# Patient Record
Sex: Female | Born: 1955
Health system: Southern US, Community
[De-identification: ages and names within clinical notes are randomized; demographics above are authoritative.]

## PROBLEM LIST (undated history)

## (undated) DIAGNOSIS — I639 Cerebral infarction, unspecified: Secondary | ICD-10-CM

## (undated) DIAGNOSIS — I10 Essential (primary) hypertension: Secondary | ICD-10-CM

## (undated) DIAGNOSIS — R32 Unspecified urinary incontinence: Secondary | ICD-10-CM

## (undated) DIAGNOSIS — F32A Depression, unspecified: Secondary | ICD-10-CM

## (undated) DIAGNOSIS — F329 Major depressive disorder, single episode, unspecified: Secondary | ICD-10-CM

## (undated) DIAGNOSIS — F419 Anxiety disorder, unspecified: Secondary | ICD-10-CM

## (undated) DIAGNOSIS — T7840XA Allergy, unspecified, initial encounter: Secondary | ICD-10-CM

## (undated) DIAGNOSIS — N189 Chronic kidney disease, unspecified: Secondary | ICD-10-CM

## (undated) DIAGNOSIS — M1990 Unspecified osteoarthritis, unspecified site: Secondary | ICD-10-CM

## (undated) HISTORY — DX: Cerebral infarction, unspecified: I63.9

## (undated) HISTORY — DX: Allergy, unspecified, initial encounter: T78.40XA

## (undated) HISTORY — PX: CHOLECYSTECTOMY: SHX55

## (undated) HISTORY — DX: Anxiety disorder, unspecified: F41.9

## (undated) HISTORY — DX: Depression, unspecified: F32.A

## (undated) HISTORY — DX: Unspecified osteoarthritis, unspecified site: M19.90

## (undated) HISTORY — DX: Chronic kidney disease, unspecified: N18.9

## (undated) HISTORY — DX: Major depressive disorder, single episode, unspecified: F32.9

## (undated) HISTORY — DX: Essential (primary) hypertension: I10

## (undated) HISTORY — DX: Unspecified urinary incontinence: R32

---

## 2009-04-19 ENCOUNTER — Ambulatory Visit: Payer: Self-pay | Admitting: Internal Medicine

## 2014-04-11 ENCOUNTER — Ambulatory Visit: Payer: Self-pay

## 2014-04-11 LAB — COMPREHENSIVE METABOLIC PANEL
ALT: 18 U/L
ANION GAP: 8 (ref 7–16)
Albumin: 3.5 g/dL (ref 3.4–5.0)
Alkaline Phosphatase: 82 U/L
BUN: 38 mg/dL — ABNORMAL HIGH (ref 7–18)
Bilirubin,Total: 0.3 mg/dL (ref 0.2–1.0)
CREATININE: 2.32 mg/dL — AB (ref 0.60–1.30)
Calcium, Total: 8.7 mg/dL (ref 8.5–10.1)
Chloride: 108 mmol/L — ABNORMAL HIGH (ref 98–107)
Co2: 27 mmol/L (ref 21–32)
EGFR (African American): 28 — ABNORMAL LOW
EGFR (Non-African Amer.): 23 — ABNORMAL LOW
GLUCOSE: 132 mg/dL — AB (ref 65–99)
Osmolality: 296 (ref 275–301)
Potassium: 4.8 mmol/L (ref 3.5–5.1)
SGOT(AST): 16 U/L (ref 15–37)
Sodium: 143 mmol/L (ref 136–145)
Total Protein: 7 g/dL (ref 6.4–8.2)

## 2014-12-04 ENCOUNTER — Ambulatory Visit (INDEPENDENT_AMBULATORY_CARE_PROVIDER_SITE_OTHER): Payer: Medicaid Other | Admitting: Family Medicine

## 2014-12-04 ENCOUNTER — Encounter: Payer: Self-pay | Admitting: Family Medicine

## 2014-12-04 VITALS — BP 160/94 | HR 94 | Temp 98.7°F | Resp 16 | Ht 68.0 in | Wt 200.2 lb

## 2014-12-04 DIAGNOSIS — Z720 Tobacco use: Secondary | ICD-10-CM | POA: Diagnosis not present

## 2014-12-04 DIAGNOSIS — N184 Chronic kidney disease, stage 4 (severe): Secondary | ICD-10-CM | POA: Diagnosis not present

## 2014-12-04 DIAGNOSIS — Z7689 Persons encountering health services in other specified circumstances: Secondary | ICD-10-CM

## 2014-12-04 DIAGNOSIS — I6930 Unspecified sequelae of cerebral infarction: Secondary | ICD-10-CM | POA: Diagnosis not present

## 2014-12-04 DIAGNOSIS — F418 Other specified anxiety disorders: Secondary | ICD-10-CM | POA: Insufficient documentation

## 2014-12-04 DIAGNOSIS — Z7189 Other specified counseling: Secondary | ICD-10-CM

## 2014-12-04 DIAGNOSIS — Z1239 Encounter for other screening for malignant neoplasm of breast: Secondary | ICD-10-CM

## 2014-12-04 DIAGNOSIS — E785 Hyperlipidemia, unspecified: Secondary | ICD-10-CM | POA: Diagnosis not present

## 2014-12-04 DIAGNOSIS — J302 Other seasonal allergic rhinitis: Secondary | ICD-10-CM

## 2014-12-04 DIAGNOSIS — I10 Essential (primary) hypertension: Secondary | ICD-10-CM | POA: Insufficient documentation

## 2014-12-04 MED ORDER — LISINOPRIL 20 MG PO TABS
20.0000 mg | ORAL_TABLET | Freq: Every day | ORAL | Status: DC
Start: 2014-12-04 — End: 2014-12-21

## 2014-12-04 MED ORDER — LORATADINE 10 MG PO TABS
10.0000 mg | ORAL_TABLET | Freq: Every day | ORAL | Status: DC
Start: 2014-12-04 — End: 2015-12-06

## 2014-12-04 MED ORDER — CO-ENZYME Q-10 50 MG PO CAPS: 50.0000 mg | ORAL_CAPSULE | Freq: Every day | ORAL | Status: DC

## 2014-12-04 MED ORDER — ATORVASTATIN CALCIUM 20 MG PO TABS: 20.0000 mg | ORAL_TABLET | Freq: Every day | ORAL | Status: DC

## 2014-12-04 MED ORDER — CITALOPRAM HYDROBROMIDE 20 MG PO TABS: 20.0000 mg | ORAL_TABLET | Freq: Every day | ORAL | Status: DC

## 2014-12-04 MED ORDER — METOPROLOL SUCCINATE ER 25 MG PO TB24: 25.0000 mg | ORAL_TABLET | Freq: Every day | ORAL | Status: DC

## 2014-12-04 NOTE — Assessment & Plan Note (Signed)
Trial of lower dose of statin to see if patient tolerates. Try co-enzyme q10 as to help muscle aches. Can do alternate day dosing. Would like to continue statin is possible for secondary prevention.

## 2014-12-04 NOTE — Addendum Note (Signed)
Addended byVincenza Hews, Savyon Loken L on: 12/04/2014 05:18 PM   Modules accepted: Orders

## 2014-12-04 NOTE — Assessment & Plan Note (Signed)
Per patient report. Check CMP today. Refer to nephrology for management as needed. Pt is adhering to renal diet at home- no tomatoes, oranges, or bananas.

## 2014-12-04 NOTE — Assessment & Plan Note (Signed)
Restart citalopram daily for anxiety. Follow-up 1 mos.

## 2014-12-04 NOTE — Assessment & Plan Note (Signed)
D/C Lisinopril-HCTZ due to kidneys. Start Lisinopril 20 mg, restart metoprolol. DASH diet reviewed. Encouraged smoking cessation and exercise. Check BP daily.  RTC in 2 weeks to check BP Consider CCB for BP control.

## 2014-12-04 NOTE — Patient Instructions (Signed)
BP: Stop Lisinopril HCTZ, and start Lisinopril 20mg  for BP. Please check your BP at home.  Your goal blood pressure is 140/90 Work on low salt/sodium diet - goal <1.5gm (1,500mg ) per day. Eat a diet high in fruits/vegetables and whole grains.  Look into mediterranean and DASH diet. Goal activity is 135min/wk of moderate intensity exercise.  This can be split into 30 minute chunks.  If you are not at this level, you can start with smaller 10-15 min increments and slowly build up activity. Look at [facility].org for more resources  Please seek immediate medical attention at ER or Urgent Care if you develop: Chest pain, pressure or tightness. Shortness of breath accompanied by nausea or diaphoresis Visual changes Numbness or tingling on one side of the body Facial droop Altered mental status Or any concerning symptoms.  Cholesterol medication: Take 1/2 tablet of current atorvastatin. Trial co-enzyme Q10 to help with muscle aches. Can take 1/2 tablet every other day to help with muscle aches.   Anxiety: Restart citalopram daily. Cut first 2 tablets in 1/2 and take 1/2 tablet for the first 4 days to help cut down on side effects.   Kidneys: We will refer you to nephrology after you get your lab work for evaluation.

## 2014-12-04 NOTE — Progress Notes (Signed)
Subjective:    Patient ID: Vanessa Camacho, female    DOB: May 14, 1956, 59 y.o.   MRN: [id number]  HPI: Vanessa Camacho is a 59 y.o. female presenting on 12/04/2014 for Establish Care   HPI Pt presents to establish care today. She has just moved from [place]. Previous care provider was in [facility]- records have been requested and will be reviewed. Current medical history includes: Stroke: 12/2013. She has never followed up with neurology. Unsure if ischemic or hemorrhagic. Pt is taking aspirin and statin for secondary prevention. Residual weakness and memory loss.  Weakness is on L side.  Anxiety/agitiation: Takes occasional ativan. Take citalopram $RemoveBeforeD'20mg'[id number]$  daily to help with her anxiety.  High blood pressure: Dx 10-20  years ago. Does not check BP at home. No daily HA or dizziness. Occasional visual changes- pt used to solve by eating banana. Pt has been out of metoprolol for several weeks and has only been taking her Lisinopril HCTZ every other day. Her BP has been running very high at home. Cholesterol: On statin but she had muscle aches so she stopped taking.  Kidney disease: Saw a kidney specialist in [place]. Pt was told she has stage 4 kidney failure. She attempts to eat renal diet. No urinary changes.  Seasonal allergies: Pt take zyrtec for allergies.   Health maintenance:  Last pap- unsure thinks a couple of years- normal.  Mammogram: unsure > 5 years.  Colonoscopy: Pt has never had one, she declines colonoscopy today but would like FOBT.  Smoker- 1 pack per week. Pt is trying to quit. She started smoking when she ran out of anti-depressant medications. Smoked on/off since age 89, <1 pack per day.  Exercise: Pt has no formal exercise- tries to stay active. Swims laps 3-4x per week.  Marijuana- 2-3 times per week.  No alcohol.   Past Medical History  Diagnosis Date  . Allergy   . Arthritis   . Depression   . Hypertension   . Stroke   . Chronic kidney disease      stage 4 renal failure past urologist was in [place]   History   Social History  . Marital Status: Married    Spouse Name: N/A  . Number of Children: N/A  . Years of Education: N/A   Occupational History  . Not on file.   Social History Main Topics  . Smoking status: Current Every Day Smoker -- 1.00 packs/day    Types: Cigarettes  . Smokeless tobacco: Not on file  . Alcohol Use: Yes  . Drug Use: No  . Sexual Activity: Not on file   Other Topics Concern  . Not on file   Social History Narrative  . No narrative on file   Family History  Problem Relation Age of Onset  . Stroke Mother   . Dementia Mother   . Hypertension Mother   . Cancer Other     sibling skin cancer   No current outpatient prescriptions on file prior to visit.   No current facility-administered medications on file prior to visit.    Review of Systems  Constitutional: Negative for fever and chills.  Eyes: Negative.   Respiratory: Negative for cough, chest tightness and wheezing.   Cardiovascular: Negative for chest pain and leg swelling.  Gastrointestinal: Negative for nausea, vomiting, abdominal pain, diarrhea and constipation.  Endocrine: Negative.  Negative for cold intolerance, heat intolerance, polydipsia, polyphagia and polyuria.  Genitourinary: Negative for dysuria and difficulty urinating.  Musculoskeletal:  Negative.   Neurological: Positive for weakness (L side residual from stroke.) and headaches (occasional.). Negative for dizziness, light-headedness and numbness.  Psychiatric/Behavioral: Positive for sleep disturbance (sleeps in living room.). The patient is nervous/anxious.    Per HPI unless specifically indicated above     Objective:    BP 160/94 mmHg  Pulse 94  Temp(Src) 98.7 F (37.1 C) (Oral)  Resp 16  Ht $R'5\' 8"'gx$  (1.727 m)  Wt 200 lb 3.2 oz (90.81 kg)  BMI 30.45 kg/m2  LMP   Wt Readings from Last 3 Encounters:  12/04/14 200 lb 3.2 oz (90.81 kg)    Physical Exam   Constitutional: She is oriented to person, place, and time. She appears well-developed and well-nourished. No distress.  Neck: Normal range of motion. Neck supple. No thyromegaly present.  Cardiovascular: Normal rate and regular rhythm.  Exam reveals no gallop and no friction rub.   No murmur heard. Pulmonary/Chest: Effort normal and breath sounds normal.  Abdominal: Soft. Bowel sounds are normal. There is no tenderness. There is no rebound.  Musculoskeletal: Normal range of motion. She exhibits no edema or tenderness.  Lymphadenopathy:    She has no cervical adenopathy.  Neurological: She is alert and oriented to person, place, and time.  Skin: Skin is warm and dry. She is not diaphoretic.  Psychiatric: She has a normal mood and affect. Her speech is normal and behavior is normal. Judgment and thought content normal. Cognition and memory are normal.   Results for orders placed or performed in visit on 04/11/14  Comprehensive metabolic panel  Result Value Ref Range   Glucose 132 (H) 65-99 mg/dL   BUN 38 (H) 7-18 mg/dL   Creatinine 2.32 (H) 0.60-1.30 mg/dL   Sodium 143 136-145 mmol/L   Potassium 4.8 3.5-5.1 mmol/L   Chloride 108 (H) 98-107 mmol/L   Co2 27 21-32 mmol/L   Calcium, Total 8.7 8.5-10.1 mg/dL   SGOT(AST) 16 15-37 Unit/L   SGPT (ALT) 18 U/L   Alkaline Phosphatase 82 Unit/L   Albumin 3.5 3.4-5.0 g/dL   Total Protein 7.0 6.4-8.2 g/dL   Bilirubin,Total 0.3 0.2-1.0 mg/dL   Osmolality 296 275-301   Anion Gap 8 7-16   EGFR (African American) 28 (L) >52mL/min   EGFR (Non-African Amer.) 23 (L) >64mL/min      Assessment & Plan:   Problem List Items Addressed This Visit      Cardiovascular and Mediastinum   Hypertension    D/C Lisinopril-HCTZ due to kidneys. Start Lisinopril 20 mg, restart metoprolol. DASH diet reviewed. Encouraged smoking cessation and exercise. Check BP daily.  RTC in 2 weeks to check BP Consider CCB for BP control.       Relevant Medications    aspirin 81 MG chewable tablet   metoprolol succinate (TOPROL-XL) 25 MG 24 hr tablet   atorvastatin (LIPITOR) 20 MG tablet   lisinopril (PRINIVIL,ZESTRIL) 20 MG tablet     Respiratory   Seasonal allergies    Start Claritin daily.       Relevant Medications   loratadine (CLARITIN) 10 MG tablet     Genitourinary   CKD (chronic kidney disease) stage 4, GFR 15-29 ml/min    Per patient report. Check CMP today. Refer to nephrology for management as needed. Pt is adhering to renal diet at home- no tomatoes, oranges, or bananas.       Relevant Medications   lisinopril (PRINIVIL,ZESTRIL) 20 MG tablet   Other Relevant Orders   Comprehensive Metabolic Panel (CMET)  Other   H/O: stroke with residual effects    Refer to neurology to establish care. Aspirin for secondary prevention. Check CMP and lipids. Goal to control BP. Trial of statin for secondary prevention.       Relevant Orders   Lipid Profile   Ambulatory referral to Neurology   Anxiety associated with depression    Restart citalopram daily for anxiety. Follow-up 1 mos.       Relevant Medications   citalopram (CELEXA) 20 MG tablet   Hyperlipidemia    Trial of lower dose of statin to see if patient tolerates. Try co-enzyme q10 as to help muscle aches. Can do alternate day dosing. Would like to continue statin is possible for secondary prevention.       Relevant Medications   aspirin 81 MG chewable tablet   metoprolol succinate (TOPROL-XL) 25 MG 24 hr tablet   atorvastatin (LIPITOR) 20 MG tablet   co-enzyme Q-10 50 MG capsule   lisinopril (PRINIVIL,ZESTRIL) 20 MG tablet   Tobacco use    Pt plans to quit smoking when she resumes her citalopram. Goal is to quit by August 1.        Other Visit Diagnoses    Encounter to establish care    -  Primary       Meds ordered this encounter  Medications  . aspirin 81 MG chewable tablet    Sig: Chew by mouth daily.  Marland Kitchen DISCONTD: metoprolol succinate (TOPROL-XL) 25 MG 24 hr  tablet    Sig: Take 25 mg by mouth daily.  Marland Kitchen DISCONTD: atorvastatin (LIPITOR) 40 MG tablet    Sig: Take 40 mg by mouth daily.  Marland Kitchen DISCONTD: lisinopril-hydrochlorothiazide (PRINZIDE,ZESTORETIC) 10-12.5 MG per tablet    Sig: Take 1 tablet by mouth 2 (two) times daily.  Marland Kitchen LORazepam (ATIVAN) 0.5 MG tablet    Sig: Take 0.5 mg by mouth every 8 (eight) hours.  . metoprolol succinate (TOPROL-XL) 25 MG 24 hr tablet    Sig: Take 1 tablet (25 mg total) by mouth daily.    Dispense:  30 tablet    Refill:  11    Order Specific Question:  Supervising Provider    Answer:  Arlis Porta [phone]  . atorvastatin (LIPITOR) 20 MG tablet    Sig: Take 1 tablet (20 mg total) by mouth daily.    Dispense:  90 tablet    Refill:  3    Order Specific Question:  Supervising Provider    Answer:  Arlis Porta [phone]  . co-enzyme Q-10 50 MG capsule    Sig: Take 1 capsule (50 mg total) by mouth daily.    Dispense:  30 capsule    Refill:  11    Order Specific Question:  Supervising Provider    Answer:  Arlis Porta [phone]  . lisinopril (PRINIVIL,ZESTRIL) 20 MG tablet    Sig: Take 1 tablet (20 mg total) by mouth daily.    Dispense:  30 tablet    Refill:  11    Order Specific Question:  Supervising Provider    Answer:  Arlis Porta [phone]  . loratadine (CLARITIN) 10 MG tablet    Sig: Take 1 tablet (10 mg total) by mouth daily.    Dispense:  30 tablet    Refill:  11    Order Specific Question:  Supervising Provider    Answer:  Arlis Porta [phone]  . citalopram (CELEXA) 20 MG tablet  Sig: Take 1 tablet (20 mg total) by mouth daily.    Dispense:  30 tablet    Refill:  11    Order Specific Question:  Supervising Provider    Answer:  Arlis Porta [phone]      Follow up plan: Return in about 2 weeks (around 12/18/2014) for BP.

## 2014-12-04 NOTE — Assessment & Plan Note (Signed)
Start Claritin daily 

## 2014-12-04 NOTE — Assessment & Plan Note (Signed)
Refer to neurology to establish care. Aspirin for secondary prevention. Check CMP and lipids. Goal to control BP. Trial of statin for secondary prevention.

## 2014-12-04 NOTE — Assessment & Plan Note (Signed)
Pt plans to quit smoking when she resumes her citalopram. Goal is to quit by August 1.

## 2014-12-14 ENCOUNTER — Encounter: Payer: Self-pay | Admitting: Family Medicine

## 2014-12-14 ENCOUNTER — Telehealth: Payer: Self-pay | Admitting: Family Medicine

## 2014-12-14 NOTE — Telephone Encounter (Signed)
Pt has appointment with Dr. Charlett Blake Neuro on 02/14/2015 at 9:30 am [facility]

## 2014-12-14 NOTE — Telephone Encounter (Signed)
Pt also has appointment with [facility] on 12/20/2014 at 3 pm pt notify reg both appointment and reminder latter send per her request. Vanessa Camacho

## 2014-12-20 ENCOUNTER — Ambulatory Visit
Admission: RE | Admit: 2014-12-20 | Discharge: 2014-12-20 | Disposition: A | Discharge status: Home / Self Care | Payer: Medicaid Other | Source: Ambulatory Visit | Attending: Family Medicine | Admitting: Family Medicine

## 2014-12-20 DIAGNOSIS — Z1231 Encounter for screening mammogram for malignant neoplasm of breast: Secondary | ICD-10-CM | POA: Diagnosis not present

## 2014-12-20 DIAGNOSIS — Z1239 Encounter for other screening for malignant neoplasm of breast: Secondary | ICD-10-CM

## 2014-12-21 ENCOUNTER — Encounter: Payer: Self-pay | Admitting: Family Medicine

## 2014-12-21 ENCOUNTER — Ambulatory Visit (INDEPENDENT_AMBULATORY_CARE_PROVIDER_SITE_OTHER): Payer: Medicaid Other | Admitting: Family Medicine

## 2014-12-21 VITALS — BP 154/88 | HR 69 | Temp 98.1°F | Resp 16 | Ht 68.0 in | Wt 197.0 lb

## 2014-12-21 DIAGNOSIS — F329 Major depressive disorder, single episode, unspecified: Secondary | ICD-10-CM | POA: Diagnosis not present

## 2014-12-21 DIAGNOSIS — F32A Depression, unspecified: Secondary | ICD-10-CM

## 2014-12-21 DIAGNOSIS — F418 Other specified anxiety disorders: Secondary | ICD-10-CM | POA: Diagnosis not present

## 2014-12-21 DIAGNOSIS — N184 Chronic kidney disease, stage 4 (severe): Secondary | ICD-10-CM

## 2014-12-21 DIAGNOSIS — I10 Essential (primary) hypertension: Secondary | ICD-10-CM | POA: Diagnosis not present

## 2014-12-21 DIAGNOSIS — R5383 Other fatigue: Secondary | ICD-10-CM

## 2014-12-21 MED ORDER — LISINOPRIL 40 MG PO TABS: 40.0000 mg | ORAL_TABLET | Freq: Every day | ORAL | Status: DC

## 2014-12-21 NOTE — Patient Instructions (Signed)
Blood pressure: Continue to monitor your BP at home. Please take 2 pills of Lisinopril in the AM until you run out of the current bottle. Then pick up new prescription  Your goal blood pressure is 140/90. Work on low salt/sodium diet - goal <1.5gm (1,500mg ) per day. Eat a diet high in fruits/vegetables and whole grains.  Look into mediterranean and DASH diet. Goal activity is 128min/wk of moderate intensity exercise.  This can be split into 30 minute chunks.  If you are not at this level, you can start with smaller 10-15 min increments and slowly build up activity. Look at [facility].org for more resources  Please seek immediate medical attention at ER or Urgent Care if you develop: Chest pain, pressure or tightness. Shortness of breath accompanied by nausea or diaphoresis Visual changes Numbness or tingling on one side of the body Facial droop Altered mental status Or any concerning symptoms.

## 2014-12-21 NOTE — Assessment & Plan Note (Signed)
Pt reports she has noticed a change since restarting celexa. Continue current dosing of celexa. Repeat PHQ-9 at next visit. Consider dose titration if needed.  Check TSH, Vitamin D

## 2014-12-21 NOTE — Progress Notes (Signed)
Subjective:    Patient ID: Vanessa Camacho, female    DOB: [birth year], 59 y.o.   MRN: [id number]  HPI: Vanessa Camacho is a 59 y.o. female presenting on 12/21/2014 for Follow-up and Hypertension  Pt presents to follow-up on Depression and hypertension. Pt is checking her BP at the drug 160/80-90. She has not yet gotten lab work done.     Depression/anxiety: Restarted her celexa, feeling well. Noticed she is feeling more calm, able to control her temper. Less anxious.  She is however reporting some fatigue and tiredness.   Hypertension This is a chronic problem. The current episode started more than 1 year ago. The problem is uncontrolled. Pertinent negatives include no anxiety, chest pain, headaches, palpitations, peripheral edema or shortness of breath. There are no associated agents to hypertension. Risk factors for coronary artery disease include smoking/tobacco exposure, family history and dyslipidemia. Past treatments include ACE inhibitors and beta blockers. The current treatment provides moderate improvement. Hypertensive end-organ damage includes CVA.    Past Medical History  Diagnosis Date  . Allergy   . Arthritis   . Depression   . Hypertension   . Stroke   . Chronic kidney disease     stage 4 renal failure past urologist was in [place]    Current Outpatient Prescriptions on File Prior to Visit  Medication Sig  . aspirin 81 MG chewable tablet Chew by mouth daily.  Marland Kitchen atorvastatin (LIPITOR) 20 MG tablet Take 1 tablet (20 mg total) by mouth daily.  . citalopram (CELEXA) 20 MG tablet Take 1 tablet (20 mg total) by mouth daily.  Marland Kitchen co-enzyme Q-10 50 MG capsule Take 1 capsule (50 mg total) by mouth daily.  Marland Kitchen loratadine (CLARITIN) 10 MG tablet Take 1 tablet (10 mg total) by mouth daily.  Marland Kitchen LORazepam (ATIVAN) 0.5 MG tablet Take 0.5 mg by mouth every 8 (eight) hours.  . metoprolol succinate (TOPROL-XL) 25 MG 24 hr tablet Take 1 tablet (25 mg total) by mouth daily.   No current  facility-administered medications on file prior to visit.    Review of Systems  Constitutional: Negative for fever and chills.  Respiratory: Negative for shortness of breath.   Cardiovascular: Negative for chest pain and palpitations.  Endocrine: Negative.   Genitourinary: Negative.   Neurological: Positive for weakness (residual from stroke). Negative for dizziness, syncope and headaches.  Psychiatric/Behavioral: Negative for dysphoric mood and agitation. The patient is nervous/anxious (improving since last visit.).    Per HPI unless specifically indicated above     Objective:    BP 154/88 mmHg  Pulse 69  Temp(Src) 98.1 F (36.7 C) (Oral)  Resp 16  Ht 5' 8" (1.727 m)  Wt 197 lb (89.359 kg)  BMI 29.96 kg/m2  Wt Readings from Last 3 Encounters:  12/21/14 197 lb (89.359 kg)  12/04/14 200 lb 3.2 oz (90.81 kg)    Physical Exam  Constitutional: She is oriented to person, place, and time. She appears well-developed and well-nourished. No distress.  Neck: Normal range of motion. Neck supple. No thyromegaly present.  Cardiovascular: Normal rate and regular rhythm.  Exam reveals no gallop and no friction rub.   No murmur heard. Pulmonary/Chest: Effort normal and breath sounds normal. No respiratory distress. She has no wheezes. She exhibits no tenderness.  Abdominal: Soft. Bowel sounds are normal. There is no tenderness. There is no rebound.  Musculoskeletal: Normal range of motion. She exhibits no edema or tenderness.  Lymphadenopathy:    She has no cervical  adenopathy.  Neurological: She is alert and oriented to person, place, and time.  Skin: Skin is warm and dry. She is not diaphoretic.   Results for orders placed or performed in visit on 04/11/14  Comprehensive metabolic panel  Result Value Ref Range   Glucose 132 (H) 65-99 mg/dL   BUN 38 (H) 7-18 mg/dL   Creatinine 2.32 (H) 0.60-1.30 mg/dL   Sodium 143 136-145 mmol/L   Potassium 4.8 3.5-5.1 mmol/L   Chloride 108 (H)  98-107 mmol/L   Co2 27 21-32 mmol/L   Calcium, Total 8.7 8.5-10.1 mg/dL   SGOT(AST) 16 15-37 Unit/L   SGPT (ALT) 18 U/L   Alkaline Phosphatase 82 Unit/L   Albumin 3.5 3.4-5.0 g/dL   Total Protein 7.0 6.4-8.2 g/dL   Bilirubin,Total 0.3 0.2-1.0 mg/dL   Osmolality 296 275-301   Anion Gap 8 7-16   EGFR (African American) 28 (L) >37m/min   EGFR (Non-African Amer.) 23 (L) >612mmin      Assessment & Plan:   Problem List Items Addressed This Visit      Cardiovascular and Mediastinum   Hypertension    Increase lisinopril to 4052maily. Pt encouraged to check BP daily- call if consistently elevated > 150/90. DASH diet reviewed.  Alarm symptoms reviewed. RTC 3 weeks.       Relevant Medications   lisinopril (PRINIVIL,ZESTRIL) 40 MG tablet     Genitourinary   CKD (chronic kidney disease) stage 4, GFR 15-29 ml/min    Check CMP- consider referral to nephrology with confirmation.         Other   Anxiety associated with depression    Pt reports she has noticed a change since restarting celexa. Continue current dosing of celexa. Repeat PHQ-9 at next visit. Consider dose titration if needed.  Check TSH, Vitamin D       Other Visit Diagnoses    Fatigue due to depression    -  Primary    Relevant Orders    Vit D  25 hydroxy (rtn osteoporosis monitoring)    TSH       Meds ordered this encounter  Medications  . lisinopril (PRINIVIL,ZESTRIL) 40 MG tablet    Sig: Take 1 tablet (40 mg total) by mouth daily.    Dispense:  30 tablet    Refill:  11    Order Specific Question:  Supervising Provider    Answer:  HAWArlis [phone]   Follow up plan: Return in about 3 weeks (around 01/11/2015).

## 2014-12-21 NOTE — Assessment & Plan Note (Signed)
Check CMP- consider referral to nephrology with confirmation.

## 2014-12-21 NOTE — Assessment & Plan Note (Signed)
Increase lisinopril to 40mg  daily. Pt encouraged to check BP daily- call if consistently elevated > 150/90. DASH diet reviewed.  Alarm symptoms reviewed. RTC 3 weeks.

## 2014-12-22 LAB — COMPREHENSIVE METABOLIC PANEL
ALBUMIN: 4.3 g/dL (ref 3.5–5.5)
ALT: 7 IU/L (ref 0–32)
AST: 9 IU/L (ref 0–40)
Albumin/Globulin Ratio: 1.7 (ref 1.1–2.5)
Alkaline Phosphatase: 81 IU/L (ref 39–117)
BILIRUBIN TOTAL: 0.5 mg/dL (ref 0.0–1.2)
BUN/Creatinine Ratio: 13 (ref 9–23)
BUN: 30 mg/dL — AB (ref 6–24)
CO2: 24 mmol/L (ref 18–29)
CREATININE: 2.31 mg/dL — AB (ref 0.57–1.00)
Calcium: 9.7 mg/dL (ref 8.7–10.2)
Chloride: 104 mmol/L (ref 97–108)
GFR, EST AFRICAN AMERICAN: 26 mL/min/{1.73_m2} — AB (ref >59)
GFR, EST NON AFRICAN AMERICAN: 23 mL/min/{1.73_m2} — AB (ref >59)
GLOBULIN, TOTAL: 2.5 g/dL (ref 1.5–4.5)
GLUCOSE: 115 mg/dL — AB (ref 65–99)
Potassium: 5.8 mmol/L — ABNORMAL HIGH (ref 3.5–5.2)
Sodium: 144 mmol/L (ref 134–144)
TOTAL PROTEIN: 6.8 g/dL (ref 6.0–8.5)

## 2014-12-22 LAB — LIPID PANEL
CHOLESTEROL TOTAL: 124 mg/dL (ref 100–199)
Chol/HDL Ratio: 3.1 ratio units (ref 0.0–4.4)
HDL: 40 mg/dL (ref >39)
LDL Calculated: 70 mg/dL (ref 0–99)
TRIGLYCERIDES: 68 mg/dL (ref 0–149)
VLDL Cholesterol Cal: 14 mg/dL (ref 5–40)

## 2014-12-24 ENCOUNTER — Telehealth: Payer: Self-pay | Admitting: Family Medicine

## 2014-12-24 DIAGNOSIS — N184 Chronic kidney disease, stage 4 (severe): Secondary | ICD-10-CM

## 2014-12-24 DIAGNOSIS — I10 Essential (primary) hypertension: Secondary | ICD-10-CM

## 2014-12-24 NOTE — Telephone Encounter (Signed)
Attempted to call patient lab results but number has been disconnected. Called pt emergency contact to get updated number. Please edit the number if they call back.

## 2014-12-24 NOTE — Telephone Encounter (Signed)
Called pt lab results- no answer. LMTCB.

## 2014-12-25 LAB — SPECIMEN STATUS REPORT

## 2014-12-25 LAB — VITAMIN D 25 HYDROXY (VIT D DEFICIENCY, FRACTURES): Vit D, 25-Hydroxy: 21.9 ng/mL — ABNORMAL LOW (ref 30.0–100.0)

## 2014-12-25 LAB — TSH: TSH: 0.79 u[IU]/mL (ref 0.450–4.500)

## 2014-12-26 MED ORDER — AMLODIPINE BESYLATE 10 MG PO TABS: 10.0000 mg | ORAL_TABLET | Freq: Every day | ORAL | Status: DC

## 2014-12-26 MED ORDER — FUROSEMIDE 20 MG PO TABS: 20.0000 mg | ORAL_TABLET | Freq: Every day | ORAL | Status: DC

## 2014-12-26 NOTE — Telephone Encounter (Signed)
Called pt- LMTCB.    Labs: Kidney function is low- would like her to see nephrology.  Her potassium is also high. She needs to STOP lisinopril, this can raise her potassium.  I am giving her 3 days worth of lasix to help reduce her potassium.  I am changing her blood pressure medication to Amlodipine 10mg  daily.

## 2014-12-27 ENCOUNTER — Encounter: Payer: Self-pay | Admitting: Family Medicine

## 2014-12-27 NOTE — Telephone Encounter (Signed)
Vanessa Camacho CMA attempted to call patient several times yesterday. Left message instructing patient to call office ASAP regarding lab work. Pt has not called back. A letter will be sent to patient household in an attempt to communicate with her. Will also attempt to call emergency contact to get in contact with the patient.

## 2014-12-27 NOTE — Telephone Encounter (Signed)
Spoke with daughter and asked her to have mother give [place] a call regarding her lab work. Daughter gave [place] an alternate number to contact patient.

## 2014-12-27 NOTE — Telephone Encounter (Signed)
Spoke wit pt advised her not to take lisinopril and start lasix for 3 days and Amlodipine instead of lisinopril pt wants a letter send out with all these info since her stroke history and understand very well today and advised her to have big sticky on freeze so that she can remember and Amy will mail her a letter with all these info and her appointment time for [facility] [place] [facility] too.

## 2015-01-11 ENCOUNTER — Encounter: Payer: Self-pay | Admitting: Family Medicine

## 2015-01-11 ENCOUNTER — Ambulatory Visit (INDEPENDENT_AMBULATORY_CARE_PROVIDER_SITE_OTHER): Payer: Medicaid Other | Admitting: Family Medicine

## 2015-01-11 VITALS — BP 146/80 | HR 60 | Temp 98.7°F | Resp 16 | Ht 68.0 in | Wt 195.6 lb

## 2015-01-11 DIAGNOSIS — I10 Essential (primary) hypertension: Secondary | ICD-10-CM

## 2015-01-11 DIAGNOSIS — N184 Chronic kidney disease, stage 4 (severe): Secondary | ICD-10-CM

## 2015-01-11 NOTE — Patient Instructions (Addendum)
Neurology Appt: February 14, 2015  Vickki Hearing, MD  [place]  [facility]  [place], [place] [address]  [phone]  [phone] (Fax)   Please continue to check your blood pressure at home. We will let the kidney specialist weigh in on whether you need to go back on the lisinopril for your blood pressure.  Your goal blood pressure is 140/90 Work on low salt/sodium diet - goal <2.5gm (2500mg ) per day. Eat a diet high in fruits/vegetables and whole grains.  Look into mediterranean and DASH diet. Goal activity is 157min/wk of moderate intensity exercise.  This can be split into 30 minute chunks.  If you are not at this level, you can start with smaller 10-15 min increments and slowly build up activity. Look at [facility].org for more resources   Please seek immediate medical attention at ER or Urgent Care if you develop: Chest pain, pressure or tightness. Shortness of breath accompanied by nausea or diaphoresis Visual changes Numbness or tingling on one side of the body Facial droop Altered mental status Or any concerning symptoms.

## 2015-01-11 NOTE — Progress Notes (Signed)
Subjective:    Patient ID: Vanessa Camacho, female    DOB: [birth year], 59 y.o.   MRN: [id number]  HPI: Vanessa Camacho is a 59 y.o. female presenting on 01/11/2015 for Hypertension   Hypertension This is a chronic problem. The problem has been waxing and waning since onset. Pertinent negatives include no anxiety, chest pain, headaches, palpitations, peripheral edema or shortness of breath. Past treatments include calcium channel blockers. The current treatment provides moderate improvement. Hypertensive end-organ damage includes kidney disease and CVA. Identifiable causes of hypertension include chronic renal disease.    Pt presents for blood pressure follow up. Her potassium increased on lisinopril. She was given a few days of lasix. Started on amlodipine. She will see nephrology on 01/17/2015 for consult on her kidney disease. Denies chest pain, shortness of breath, visual changes, daily HA or floaters. Checking BP at home- avg 150/80.   Past Medical History  Diagnosis Date  . Allergy   . Arthritis   . Depression   . Hypertension   . Stroke   . Chronic kidney disease     stage 4 renal failure past urologist was in [place]    Current Outpatient Prescriptions on File Prior to Visit  Medication Sig  . amLODipine (NORVASC) 10 MG tablet Take 1 tablet (10 mg total) by mouth daily.  Marland Kitchen aspirin 81 MG chewable tablet Chew by mouth daily.  Marland Kitchen atorvastatin (LIPITOR) 20 MG tablet Take 1 tablet (20 mg total) by mouth daily.  . citalopram (CELEXA) 20 MG tablet Take 1 tablet (20 mg total) by mouth daily.  Marland Kitchen co-enzyme Q-10 50 MG capsule Take 1 capsule (50 mg total) by mouth daily.  Marland Kitchen loratadine (CLARITIN) 10 MG tablet Take 1 tablet (10 mg total) by mouth daily.  Marland Kitchen LORazepam (ATIVAN) 0.5 MG tablet Take 0.5 mg by mouth every 8 (eight) hours.  . metoprolol succinate (TOPROL-XL) 25 MG 24 hr tablet Take 1 tablet (25 mg total) by mouth daily.   No current facility-administered medications on file prior  to visit.    Review of Systems  Constitutional: Negative for fever and chills.  HENT: Negative.   Respiratory: Negative for shortness of breath.   Cardiovascular: Negative for chest pain, palpitations and leg swelling.  Gastrointestinal: Negative for abdominal pain and abdominal distention.  Endocrine: Negative for cold intolerance, heat intolerance, polydipsia, polyphagia and polyuria.  Genitourinary: Negative.   Musculoskeletal: Negative.   Neurological: Negative for dizziness, numbness and headaches.  Psychiatric/Behavioral: Negative.    Per HPI unless specifically indicated above     Objective:    BP 146/80 mmHg  Pulse 60  Temp(Src) 98.7 F (37.1 C) (Oral)  Resp 16  Ht 5\' 8"  (1.727 m)  Wt 195 lb 9.6 oz (88.724 kg)  BMI 29.75 kg/m2  Wt Readings from Last 3 Encounters:  01/11/15 195 lb 9.6 oz (88.724 kg)  12/21/14 197 lb (89.359 kg)  12/04/14 200 lb 3.2 oz (90.81 kg)    Physical Exam  Constitutional: She is oriented to person, place, and time. She appears well-developed and well-nourished. No distress.  Neck: Normal range of motion. Neck supple. No thyromegaly present.  Cardiovascular: Normal rate and regular rhythm.  Exam reveals no gallop and no friction rub.   No murmur heard. Pulmonary/Chest: Effort normal and breath sounds normal.  Abdominal: Soft. Bowel sounds are normal. There is no tenderness. There is no rebound.  Musculoskeletal: Normal range of motion. She exhibits no edema or tenderness.  Lymphadenopathy:    She  has no cervical adenopathy.  Neurological: She is alert and oriented to person, place, and time.  Skin: Skin is warm and dry. She is not diaphoretic.   Results for orders placed or performed in visit on 12/04/14  Comprehensive Metabolic Panel (CMET)  Result Value Ref Range   Glucose 115 (H) 65 - 99 mg/dL   BUN 30 (H) 6 - 24 mg/dL   Creatinine, Ser 2.31 (H) 0.57 - 1.00 mg/dL   GFR calc non Af Amer 23 (L) >59 mL/min/1.73   GFR calc Af Amer 26  (L) >59 mL/min/1.73   BUN/Creatinine Ratio 13 9 - 23   Sodium 144 134 - 144 mmol/L   Potassium 5.8 (H) 3.5 - 5.2 mmol/L   Chloride 104 97 - 108 mmol/L   CO2 24 18 - 29 mmol/L   Calcium 9.7 8.7 - 10.2 mg/dL   Total Protein 6.8 6.0 - 8.5 g/dL   Albumin 4.3 3.5 - 5.5 g/dL   Globulin, Total 2.5 1.5 - 4.5 g/dL   Albumin/Globulin Ratio 1.7 1.1 - 2.5   Bilirubin Total 0.5 0.0 - 1.2 mg/dL   Alkaline Phosphatase 81 39 - 117 IU/L   AST 9 0 - 40 IU/L   ALT 7 0 - 32 IU/L  Lipid Profile  Result Value Ref Range   Cholesterol, Total 124 100 - 199 mg/dL   Triglycerides 68 0 - 149 mg/dL   HDL 40 >39 mg/dL   VLDL Cholesterol Cal 14 5 - 40 mg/dL   LDL Calculated 70 0 - 99 mg/dL   Chol/HDL Ratio 3.1 0.0 - 4.4 ratio units  Vit D  25 hydroxy (rtn osteoporosis monitoring)  Result Value Ref Range   Vit D, 25-Hydroxy 21.9 (L) 30.0 - 100.0 ng/mL  TSH  Result Value Ref Range   TSH 0.790 0.450 - 4.500 uIU/mL  Specimen status report  Result Value Ref Range   specimen status report Comment       Assessment & Plan:   Problem List Items Addressed This Visit      Cardiovascular and Mediastinum   Hypertension    Continue with current regimen due to kidney function. Would appreciate nephrology recommendations on benefits of ACE/ARB at this point. Consider hydralazine for further control as needed.       Relevant Orders   Basic Metabolic Panel (BMET)     Genitourinary   CKD (chronic kidney disease) stage 4, GFR 15-29 ml/min - Primary    Recheck BMP to ensure K is WNL. Pt will see nephrology on 01/17/15 for initial consultation. Renal diet reinforced.       Relevant Orders   Basic Metabolic Panel (BMET)      Meds ordered this encounter  Medications  . amoxicillin (AMOXIL) 500 MG capsule    Sig: TK 1 C PO TID TAT    Refill:  0  . chlorhexidine (PERIDEX) 0.12 % solution    Sig:     Refill:  0      Follow up plan: Return in about 3 months (around 04/13/2015) for BP check.

## 2015-01-11 NOTE — Assessment & Plan Note (Signed)
Recheck BMP to ensure K is WNL. Pt will see nephrology on 01/17/15 for initial consultation. Renal diet reinforced.

## 2015-01-11 NOTE — Assessment & Plan Note (Signed)
Continue with current regimen due to kidney function. Would appreciate nephrology recommendations on benefits of ACE/ARB at this point. Consider hydralazine for further control as needed.

## 2015-01-17 ENCOUNTER — Other Ambulatory Visit: Payer: Self-pay | Admitting: Nephrology

## 2015-01-17 DIAGNOSIS — N184 Chronic kidney disease, stage 4 (severe): Secondary | ICD-10-CM

## 2015-01-22 ENCOUNTER — Ambulatory Visit
Admission: RE | Admit: 2015-01-22 | Discharge: 2015-01-22 | Disposition: A | Discharge status: Home / Self Care | Payer: Medicaid Other | Source: Ambulatory Visit | Attending: Nephrology | Admitting: Nephrology

## 2015-01-22 DIAGNOSIS — N184 Chronic kidney disease, stage 4 (severe): Secondary | ICD-10-CM | POA: Diagnosis not present

## 2015-02-14 DIAGNOSIS — R413 Other amnesia: Secondary | ICD-10-CM | POA: Insufficient documentation

## 2015-02-14 DIAGNOSIS — Z8673 Personal history of transient ischemic attack (TIA), and cerebral infarction without residual deficits: Secondary | ICD-10-CM

## 2015-02-19 ENCOUNTER — Other Ambulatory Visit: Payer: Self-pay | Admitting: Neurology

## 2015-02-19 DIAGNOSIS — R413 Other amnesia: Secondary | ICD-10-CM

## 2015-02-22 ENCOUNTER — Ambulatory Visit
Admission: RE | Admit: 2015-02-22 | Discharge: 2015-02-22 | Disposition: A | Discharge status: Home / Self Care | Payer: Medicaid Other | Source: Ambulatory Visit | Attending: Neurology | Admitting: Neurology

## 2015-02-22 DIAGNOSIS — Z8673 Personal history of transient ischemic attack (TIA), and cerebral infarction without residual deficits: Secondary | ICD-10-CM | POA: Insufficient documentation

## 2015-02-22 DIAGNOSIS — R413 Other amnesia: Secondary | ICD-10-CM | POA: Diagnosis not present

## 2015-03-05 DIAGNOSIS — E538 Deficiency of other specified B group vitamins: Secondary | ICD-10-CM | POA: Insufficient documentation

## 2015-04-22 ENCOUNTER — Ambulatory Visit: Payer: Medicaid Other | Admitting: Family Medicine

## 2015-05-06 ENCOUNTER — Ambulatory Visit (INDEPENDENT_AMBULATORY_CARE_PROVIDER_SITE_OTHER): Payer: Medicaid Other | Admitting: Family Medicine

## 2015-05-06 ENCOUNTER — Encounter: Payer: Self-pay | Admitting: Family Medicine

## 2015-05-06 VITALS — BP 127/80 | HR 64 | Temp 98.1°F | Resp 16 | Ht 68.0 in | Wt 198.2 lb

## 2015-05-06 DIAGNOSIS — Z720 Tobacco use: Secondary | ICD-10-CM | POA: Diagnosis not present

## 2015-05-06 DIAGNOSIS — R7309 Other abnormal glucose: Secondary | ICD-10-CM | POA: Diagnosis not present

## 2015-05-06 DIAGNOSIS — E785 Hyperlipidemia, unspecified: Secondary | ICD-10-CM

## 2015-05-06 DIAGNOSIS — I129 Hypertensive chronic kidney disease with stage 1 through stage 4 chronic kidney disease, or unspecified chronic kidney disease: Secondary | ICD-10-CM | POA: Diagnosis not present

## 2015-05-06 DIAGNOSIS — F418 Other specified anxiety disorders: Secondary | ICD-10-CM | POA: Diagnosis not present

## 2015-05-06 DIAGNOSIS — N184 Chronic kidney disease, stage 4 (severe): Secondary | ICD-10-CM

## 2015-05-06 MED ORDER — LORAZEPAM 0.5 MG PO TABS: 0.5000 mg | ORAL_TABLET | Freq: Every day | ORAL | Status: DC

## 2015-05-06 NOTE — Progress Notes (Signed)
Subjective:    Patient ID: Vanessa Camacho, female    DOB: 1955-11-07, 59 y.o.   MRN: [id number]  HPI: Vanessa Camacho is a 59 y.o. female presenting on 05/06/2015 for Hypertension; Anxiety; and Hyperlipidemia   Hypertension This is a chronic problem. Associated symptoms include anxiety. Pertinent negatives include no chest pain, headaches, orthopnea, palpitations, peripheral edema or shortness of breath. Past treatments include calcium channel blockers and beta blockers. Hypertensive end-organ damage includes kidney disease. Identifiable causes of hypertension include chronic renal disease.  Anxiety Symptoms include nervous/anxious behavior. Patient reports no chest pain, dizziness, palpitations or shortness of breath.    Hyperlipidemia This is a chronic problem. Exacerbating diseases include chronic renal disease and obesity. Pertinent negatives include no chest pain, leg pain, myalgias or shortness of breath. Current antihyperlipidemic treatment includes statins. Risk factors for coronary artery disease include diabetes mellitus, obesity and hypertension.    Pt presents for BP follow-up. Doing well at home. Blood pressure is doing well. Taking amlodipine 10 mg daily and metoprolol 25 mg.  No HA or visual changes. No chest pain.  CKD: sees Nephrology- last seen on 11/21. Will see them in the spring.   Anxiety: Increased. Mother just passed away. Is caring for grandchildren. Taking celexa 20 mg daily. Working but needs lorazepam to help with acute increase in anxiety.   GERD: Increase in symptoms. Has been using rolaids at home.  Past Medical History  Diagnosis Date  . Allergy   . Arthritis   . Depression   . Hypertension   . Stroke ([facility])   . Chronic kidney disease     stage 4 renal failure past urologist was in [place]    Current Outpatient Prescriptions on File Prior to Visit  Medication Sig  . amLODipine (NORVASC) 10 MG tablet Take 1 tablet (10 mg total) by mouth daily.  Marland Kitchen  aspirin 81 MG chewable tablet Chew by mouth daily.  Marland Kitchen atorvastatin (LIPITOR) 20 MG tablet Take 1 tablet (20 mg total) by mouth daily.  . chlorhexidine (PERIDEX) 0.12 % solution   . citalopram (CELEXA) 20 MG tablet Take 1 tablet (20 mg total) by mouth daily.  Marland Kitchen co-enzyme Q-10 50 MG capsule Take 1 capsule (50 mg total) by mouth daily.  Marland Kitchen loratadine (CLARITIN) 10 MG tablet Take 1 tablet (10 mg total) by mouth daily.  . metoprolol succinate (TOPROL-XL) 25 MG 24 hr tablet Take 1 tablet (25 mg total) by mouth daily.   No current facility-administered medications on file prior to visit.    Review of Systems  Constitutional: Negative for fever and chills.  HENT: Negative.   Respiratory: Negative for shortness of breath.   Cardiovascular: Negative for chest pain, palpitations, orthopnea and leg swelling.  Gastrointestinal: Negative for abdominal pain and abdominal distention.  Endocrine: Negative for cold intolerance, heat intolerance, polydipsia, polyphagia and polyuria.  Genitourinary: Negative.   Musculoskeletal: Negative.  Negative for myalgias.  Neurological: Negative for dizziness, numbness and headaches.  Psychiatric/Behavioral: The patient is nervous/anxious.    Per HPI unless specifically indicated above     Objective:    BP 127/80 mmHg  Pulse 64  Temp(Src) 98.1 F (36.7 C) (Oral)  Resp 16  Ht 5\' 8"  (1.727 m)  Wt 198 lb 3.2 oz (89.903 kg)  BMI 30.14 kg/m2  Wt Readings from Last 3 Encounters:  05/06/15 198 lb 3.2 oz (89.903 kg)  02/22/15 195 lb (88.451 kg)  01/11/15 195 lb 9.6 oz (88.724 kg)    Depression screen  PHQ 2/9 05/06/2015  Decreased Interest 1  Down, Depressed, Hopeless 1  PHQ - 2 Score 2  Altered sleeping 3  Tired, decreased energy 2  Change in appetite 3  Feeling bad or failure about yourself  0  Trouble concentrating 1  Moving slowly or fidgety/restless 2  Suicidal thoughts 0  PHQ-9 Score 13  Difficult doing work/chores Somewhat difficult   GAD 7 :  Generalized Anxiety Score 05/06/2015  Nervous, Anxious, on Edge 2  Control/stop worrying 1  Worry too much - different things 0  Trouble relaxing 3  Restless 3  Easily annoyed or irritable 3  Afraid - awful might happen 0  Total GAD 7 Score 12  Anxiety Difficulty Somewhat difficult     Physical Exam Results for orders placed or performed in visit on 12/04/14  Comprehensive Metabolic Panel (CMET)  Result Value Ref Range   Glucose 115 (H) 65 - 99 mg/dL   BUN 30 (H) 6 - 24 mg/dL   Creatinine, Ser 2.31 (H) 0.57 - 1.00 mg/dL   GFR calc non Af Amer 23 (L) >59 mL/min/1.73   GFR calc Af Amer 26 (L) >59 mL/min/1.73   BUN/Creatinine Ratio 13 9 - 23   Sodium 144 134 - 144 mmol/L   Potassium 5.8 (H) 3.5 - 5.2 mmol/L   Chloride 104 97 - 108 mmol/L   CO2 24 18 - 29 mmol/L   Calcium 9.7 8.7 - 10.2 mg/dL   Total Protein 6.8 6.0 - 8.5 g/dL   Albumin 4.3 3.5 - 5.5 g/dL   Globulin, Total 2.5 1.5 - 4.5 g/dL   Albumin/Globulin Ratio 1.7 1.1 - 2.5   Bilirubin Total 0.5 0.0 - 1.2 mg/dL   Alkaline Phosphatase 81 39 - 117 IU/L   AST 9 0 - 40 IU/L   ALT 7 0 - 32 IU/L  Lipid Profile  Result Value Ref Range   Cholesterol, Total 124 100 - 199 mg/dL   Triglycerides 68 0 - 149 mg/dL   HDL 40 >39 mg/dL   VLDL Cholesterol Cal 14 5 - 40 mg/dL   LDL Calculated 70 0 - 99 mg/dL   Chol/HDL Ratio 3.1 0.0 - 4.4 ratio units  Vit D  25 hydroxy (rtn osteoporosis monitoring)  Result Value Ref Range   Vit D, 25-Hydroxy 21.9 (L) 30.0 - 100.0 ng/mL  TSH  Result Value Ref Range   TSH 0.790 0.450 - 4.500 uIU/mL  Specimen status report  Result Value Ref Range   specimen status report Comment       Assessment & Plan:   Problem List Items Addressed This Visit      Cardiovascular and Mediastinum   Hypertension - Primary    Controlled in the office today. Continue current medication regimen. CKD monitored by nephrology. They have not added an ACE due to potassium levels.  CMP today.  RTC 3 mos.          Other   Anxiety associated with depression    Continue celexa. Renew PRN lorazepam for acute anxiety reaction to mother's death.  RTC if anxiety symptoms do not improve.       Relevant Medications   LORazepam (ATIVAN) 0.5 MG tablet   Hyperlipidemia    Continue atorvastatin for secondary prevention. Check CMP and lipid panel today.       Relevant Orders   Lipid panel   Tobacco use    Counseled to quit smoking. Pt is not ready to quit at this time.  Other Visit Diagnoses    Elevated glucose        Check HgA1c to rule out diabetes.     Relevant Orders    Hemoglobin A1c       Meds ordered this encounter  Medications  . LORazepam (ATIVAN) 0.5 MG tablet    Sig: Take 1 tablet (0.5 mg total) by mouth at bedtime.    Dispense:  30 tablet    Refill:  0    Order Specific Question:  Supervising Provider    Answer:  Arlis Porta [id number]      Follow up plan: Return in about 3 months (around 08/06/2015) for Hypertension.

## 2015-05-06 NOTE — Assessment & Plan Note (Signed)
Continue atorvastatin for secondary prevention. Check CMP and lipid panel today.

## 2015-05-06 NOTE — Patient Instructions (Addendum)
Keep up the good work with your blood pressure. We will double check your cholesterol level today.   Your goal blood pressure is 140/90 Work on low salt/sodium diet - goal <2.5gm (2,500mg ) per day. Eat a diet high in fruits/vegetables and whole grains.  Look into mediterranean and DASH diet. Goal activity is 163min/wk of moderate intensity exercise.  This can be split into 30 minute chunks.  If you are not at this level, you can start with smaller 10-15 min increments and slowly build up activity. Look at [facility] for more resources

## 2015-05-06 NOTE — Assessment & Plan Note (Signed)
Controlled in the office today. Continue current medication regimen. CKD monitored by nephrology. They have not added an ACE due to potassium levels.  CMP today.  RTC 3 mos.

## 2015-05-06 NOTE — Assessment & Plan Note (Signed)
Counseled to quit smoking. Pt is not ready to quit at this time.

## 2015-05-06 NOTE — Assessment & Plan Note (Signed)
Continue celexa. Renew PRN lorazepam for acute anxiety reaction to mother's death.  RTC if anxiety symptoms do not improve.

## 2015-05-10 ENCOUNTER — Encounter: Payer: Medicaid Other | Admitting: Family Medicine

## 2015-08-06 ENCOUNTER — Ambulatory Visit: Payer: Medicaid Other | Admitting: Family Medicine

## 2015-08-27 ENCOUNTER — Ambulatory Visit (INDEPENDENT_AMBULATORY_CARE_PROVIDER_SITE_OTHER): Payer: Medicaid Other | Admitting: Family Medicine

## 2015-08-27 VITALS — BP 147/96 | HR 69 | Temp 98.7°F | Resp 16 | Ht 68.0 in | Wt 201.4 lb

## 2015-08-27 DIAGNOSIS — Z720 Tobacco use: Secondary | ICD-10-CM | POA: Diagnosis not present

## 2015-08-27 DIAGNOSIS — J301 Allergic rhinitis due to pollen: Secondary | ICD-10-CM

## 2015-08-27 DIAGNOSIS — Z124 Encounter for screening for malignant neoplasm of cervix: Secondary | ICD-10-CM | POA: Diagnosis not present

## 2015-08-27 DIAGNOSIS — Z0000 Encounter for general adult medical examination without abnormal findings: Secondary | ICD-10-CM | POA: Diagnosis not present

## 2015-08-27 DIAGNOSIS — Z01419 Encounter for gynecological examination (general) (routine) without abnormal findings: Secondary | ICD-10-CM

## 2015-08-27 MED ORDER — FLUTICASONE PROPIONATE 50 MCG/ACT NA SUSP: 2.0000 | Freq: Every day | NASAL | Status: DC

## 2015-08-27 MED ORDER — OXYMETAZOLINE HCL 0.05 % NA SOLN: NASAL | Status: DC

## 2015-08-27 NOTE — Assessment & Plan Note (Signed)
Encouraged pt to quit smoking

## 2015-08-27 NOTE — Patient Instructions (Addendum)
Health Maintenance, Female Adopting a healthy lifestyle and getting preventive care can go a long way to promote health and wellness. Talk with your health care provider about what schedule of regular examinations is right for you. This is a good chance for you to check in with your provider about disease prevention and staying healthy. In between checkups, there are plenty of things you can do on your own. Experts have done a lot of research about which lifestyle changes and preventive measures are most likely to keep you healthy. Ask your health care provider for more information. WEIGHT AND DIET  Eat a healthy diet  Be sure to include plenty of vegetables, fruits, low-fat dairy products, and lean protein.  Do not eat a lot of foods high in solid fats, added sugars, or salt.  Get regular exercise. This is one of the most important things you can do for your health.  Most adults should exercise for at least 150 minutes each week. The exercise should increase your heart rate and make you sweat (moderate-intensity exercise).  Most adults should also do strengthening exercises at least twice a week. This is in addition to the moderate-intensity exercise.  Maintain a healthy weight  Body mass index (BMI) is a measurement that can be used to identify possible weight problems. It estimates body fat based on height and weight. Your health care provider can help determine your BMI and help you achieve or maintain a healthy weight.  For females 20 years of age and older:   A BMI below 18.5 is considered underweight.  A BMI of 18.5 to 24.9 is normal.  A BMI of 25 to 29.9 is considered overweight.  A BMI of 30 and above is considered obese.  Watch levels of cholesterol and blood lipids  You should start having your blood tested for lipids and cholesterol at 60 years of age, then have this test every 5 years.  You may need to have your cholesterol levels checked more often if:  Your lipid  or cholesterol levels are high.  You are older than 60 years of age.  You are at high risk for heart disease.  CANCER SCREENING   Lung Cancer  Lung cancer screening is recommended for adults 55-80 years old who are at high risk for lung cancer because of a history of smoking.  A yearly low-dose CT scan of the lungs is recommended for people who:  Currently smoke.  Have quit within the past 15 years.  Have at least a 30-pack-year history of smoking. A pack year is smoking an average of one pack of cigarettes a day for 1 year.  Yearly screening should continue until it has been 15 years since you quit.  Yearly screening should stop if you develop a health problem that would prevent you from having lung cancer treatment.  Breast Cancer  Practice breast self-awareness. This means understanding how your breasts normally appear and feel.  It also means doing regular breast self-exams. Let your health care provider know about any changes, no matter how small.  If you are in your 20s or 30s, you should have a clinical breast exam (CBE) by a health care provider every 1-3 years as part of a regular health exam.  If you are 40 or older, have a CBE every year. Also consider having a breast X-ray (mammogram) every year.  If you have a family history of breast cancer, talk to your health care provider about genetic screening.  If you   are at high risk for breast cancer, talk to your health care provider about having an MRI and a mammogram every year.  Breast cancer gene (BRCA) assessment is recommended for women who have family members with BRCA-related cancers. BRCA-related cancers include:  Breast.  Ovarian.  Tubal.  Peritoneal cancers.  Results of the assessment will determine the need for genetic counseling and BRCA1 and BRCA2 testing. Cervical Cancer Your health care provider may recommend that you be screened regularly for cancer of the pelvic organs (ovaries, uterus, and  vagina). This screening involves a pelvic examination, including checking for microscopic changes to the surface of your cervix (Pap test). You may be encouraged to have this screening done every 3 years, beginning at age 21.  For women ages 30-65, health care providers may recommend pelvic exams and Pap testing every 3 years, or they may recommend the Pap and pelvic exam, combined with testing for human papilloma virus (HPV), every 5 years. Some types of HPV increase your risk of cervical cancer. Testing for HPV may also be done on women of any age with unclear Pap test results.  Other health care providers may not recommend any screening for nonpregnant women who are considered low risk for pelvic cancer and who do not have symptoms. Ask your health care provider if a screening pelvic exam is right for you.  If you have had past treatment for cervical cancer or a condition that could lead to cancer, you need Pap tests and screening for cancer for at least 20 years after your treatment. If Pap tests have been discontinued, your risk factors (such as having a new sexual partner) need to be reassessed to determine if screening should resume. Some women have medical problems that increase the chance of getting cervical cancer. In these cases, your health care provider may recommend more frequent screening and Pap tests. Colorectal Cancer  This type of cancer can be detected and often prevented.  Routine colorectal cancer screening usually begins at 60 years of age and continues through 60 years of age.  Your health care provider may recommend screening at an earlier age if you have risk factors for colon cancer.  Your health care provider may also recommend using home test kits to check for hidden blood in the stool.  A small camera at the end of a tube can be used to examine your colon directly (sigmoidoscopy or colonoscopy). This is done to check for the earliest forms of colorectal  cancer.  Routine screening usually begins at age 50.  Direct examination of the colon should be repeated every 5-10 years through 60 years of age. However, you may need to be screened more often if early forms of precancerous polyps or small growths are found. Skin Cancer  Check your skin from head to toe regularly.  Tell your health care provider about any new moles or changes in moles, especially if there is a change in a mole's shape or color.  Also tell your health care provider if you have a mole that is larger than the size of a pencil eraser.  Always use sunscreen. Apply sunscreen liberally and repeatedly throughout the day.  Protect yourself by wearing long sleeves, pants, a wide-brimmed hat, and sunglasses whenever you are outside. HEART DISEASE, DIABETES, AND HIGH BLOOD PRESSURE   High blood pressure causes heart disease and increases the risk of stroke. High blood pressure is more likely to develop in:  People who have blood pressure in the high end   of the normal range (130-139/85-89 mm Hg).  People who are overweight or obese.  People who are African American.  If you are 38-23 years of age, have your blood pressure checked every 3-5 years. If you are 61 years of age or older, have your blood pressure checked every year. You should have your blood pressure measured twice--once when you are at a hospital or clinic, and once when you are not at a hospital or clinic. Record the average of the two measurements. To check your blood pressure when you are not at a hospital or clinic, you can use:  An automated blood pressure machine at a pharmacy.  A home blood pressure monitor.  If you are between 45 years and 39 years old, ask your health care provider if you should take aspirin to prevent strokes.  Have regular diabetes screenings. This involves taking a blood sample to check your fasting blood sugar level.  If you are at a normal weight and have a low risk for diabetes,  have this test once every three years after 60 years of age.  If you are overweight and have a high risk for diabetes, consider being tested at a younger age or more often. PREVENTING INFECTION  Hepatitis B  If you have a higher risk for hepatitis B, you should be screened for this virus. You are considered at high risk for hepatitis B if:  You were born in a country where hepatitis B is common. Ask your health care provider which countries are considered high risk.  Your parents were born in a high-risk country, and you have not been immunized against hepatitis B (hepatitis B vaccine).  You have HIV or AIDS.  You use needles to inject street drugs.  You live with someone who has hepatitis B.  You have had sex with someone who has hepatitis B.  You get hemodialysis treatment.  You take certain medicines for conditions, including cancer, organ transplantation, and autoimmune conditions. Hepatitis C  Blood testing is recommended for:  Everyone born from [birth year] through 1965.  Anyone with known risk factors for hepatitis C. Sexually transmitted infections (STIs)  You should be screened for sexually transmitted infections (STIs) including gonorrhea and chlamydia if:  You are sexually active and are younger than 60 years of age.  You are older than 60 years of age and your health care provider tells you that you are at risk for this type of infection.  Your sexual activity has changed since you were last screened and you are at an increased risk for chlamydia or gonorrhea. Ask your health care provider if you are at risk.  If you do not have HIV, but are at risk, it may be recommended that you take a prescription medicine daily to prevent HIV infection. This is called pre-exposure prophylaxis (PrEP). You are considered at risk if:  You are sexually active and do not regularly use condoms or know the HIV status of your partner(s).  You take drugs by injection.  You are sexually  active with a partner who has HIV. Talk with your health care provider about whether you are at high risk of being infected with HIV. If you choose to begin PrEP, you should first be tested for HIV. You should then be tested every 3 months for as long as you are taking PrEP.  PREGNANCY   If you are premenopausal and you may become pregnant, ask your health care provider about preconception counseling.  If you may  become pregnant, take 400 to 800 micrograms (mcg) of folic acid every day.  If you want to prevent pregnancy, talk to your health care provider about birth control (contraception). OSTEOPOROSIS AND MENOPAUSE   Osteoporosis is a disease in which the bones lose minerals and strength with aging. This can result in serious bone fractures. Your risk for osteoporosis can be identified using a bone density scan.  If you are 61 years of age or older, or if you are at risk for osteoporosis and fractures, ask your health care provider if you should be screened.  Ask your health care provider whether you should take a calcium or vitamin D supplement to lower your risk for osteoporosis.  Menopause may have certain physical symptoms and risks.  Hormone replacement therapy may reduce some of these symptoms and risks. Talk to your health care provider about whether hormone replacement therapy is right for you.  HOME CARE INSTRUCTIONS   Schedule regular health, dental, and eye exams.  Stay current with your immunizations.   Do not use any tobacco products including cigarettes, chewing tobacco, or electronic cigarettes.  If you are pregnant, do not drink alcohol.  If you are breastfeeding, limit how much and how often you drink alcohol.  Limit alcohol intake to no more than 1 drink per day for nonpregnant women. One drink equals 12 ounces of beer, 5 ounces of wine, or 1 ounces of hard liquor.  Do not use street drugs.  Do not share needles.  Ask your health care provider for help if  you need support or information about quitting drugs.  Tell your health care provider if you often feel depressed.  Tell your health care provider if you have ever been abused or do not feel safe at home.   This information is not intended to replace advice given to you by your health care provider. Make sure you discuss any questions you have with your health care provider.   Document Released: 12/08/2010 Document Revised: 06/15/2014 Document Reviewed: 04/26/2013 Elsevier Interactive Patient Education [employer].

## 2015-08-27 NOTE — Progress Notes (Signed)
Subjective:    Patient ID: Vanessa Camacho, female    DOB: 03/03/1956, 60 y.o.   MRN: [id number]  HPI: Vanessa Camacho is a 60 y.o. female presenting on 08/27/2015 for Gynecologic Exam   HPI  Pt presents well woman exam. Last mammogram in July 2016. Unsure last pap. Over 10 years. 1 abnormal pap "ages ago.' Had a previous leep procedure. Menopausal > 10 years ago. No vaginal bleeding or discharge. Not currently sexually active. Does breast self exams. No lumps, bumps, or changes.  Has some urine leakage with coughing, sneezing. Does kegel exercise.  Overall doing well. Having issues with allergies. L ear feels clogged. Not taking a flonase at home. Taking claritin daily,.   Past Medical History  Diagnosis Date  . Allergy   . Arthritis   . Depression   . Hypertension   . Stroke ([place])   . Chronic kidney disease     stage 4 renal failure past urologist was in [place] History  . Marital Status: Married    Spouse Name: N/A  . Number of Children: N/A  . Years of Education: N/A   Occupational History  . Not on file.   Social History Main Topics  . Smoking status: Current Some Day Smoker -- 1.00 packs/day    Types: Cigarettes    Last Attempt to Quit: 01/01/2015  . Smokeless tobacco: Not on file  . Alcohol Use: 0.0 oz/week    0 Standard drinks or equivalent per week  . Drug Use: No  . Sexual Activity: Not on file   Other Topics Concern  . Not on file   Social History Narrative   Family History  Problem Relation Age of Onset  . Stroke Mother   . Dementia Mother   . Hypertension Mother   . Cancer Other     sibling skin cancer   Current Outpatient Prescriptions on File Prior to Visit  Medication Sig  . amLODipine (NORVASC) 10 MG tablet Take 1 tablet (10 mg total) by mouth daily.  Marland Kitchen aspirin 81 MG chewable tablet Chew by mouth daily.  Marland Kitchen atorvastatin (LIPITOR) 20 MG tablet Take 1 tablet (20 mg total) by mouth daily.  . chlorhexidine (PERIDEX)  0.12 % solution   . citalopram (CELEXA) 20 MG tablet Take 1 tablet (20 mg total) by mouth daily.  Marland Kitchen co-enzyme Q-10 50 MG capsule Take 1 capsule (50 mg total) by mouth daily.  Marland Kitchen loratadine (CLARITIN) 10 MG tablet Take 1 tablet (10 mg total) by mouth daily.  Marland Kitchen LORazepam (ATIVAN) 0.5 MG tablet Take 1 tablet (0.5 mg total) by mouth at bedtime.  . metoprolol succinate (TOPROL-XL) 25 MG 24 hr tablet Take 1 tablet (25 mg total) by mouth daily.   No current facility-administered medications on file prior to visit.    Review of Systems  Constitutional: Negative for fever and chills.  HENT: Negative.   Respiratory: Negative for cough, chest tightness and wheezing.   Cardiovascular: Negative for chest pain and leg swelling.  Gastrointestinal: Negative for nausea, vomiting, abdominal pain, diarrhea and constipation.  Endocrine: Negative.  Negative for cold intolerance, heat intolerance, polydipsia, polyphagia and polyuria.  Genitourinary: Negative for dysuria, vaginal bleeding, vaginal discharge, difficulty urinating, vaginal pain and pelvic pain.       Stress incontinence   Musculoskeletal: Negative.   Neurological: Negative for dizziness, light-headedness and numbness.  Psychiatric/Behavioral: Negative.    Per HPI unless specifically indicated above  Objective:    BP 147/96 mmHg  Pulse 69  Temp(Src) 98.7 F (37.1 C) (Oral)  Resp 16  Ht 5\' 8"  (1.727 m)  Wt 201 lb 6.4 oz (91.354 kg)  BMI 30.63 kg/m2  Wt Readings from Last 3 Encounters:  08/27/15 201 lb 6.4 oz (91.354 kg)  05/06/15 198 lb 3.2 oz (89.903 kg)  02/22/15 195 lb (88.451 kg)    Physical Exam  Constitutional: She is oriented to person, place, and time. She appears well-developed and well-nourished.  HENT:  Head: Normocephalic and atraumatic.  Right Ear: Hearing and tympanic membrane normal.  Left Ear: A middle ear effusion is present.  Nose: Mucosal edema and rhinorrhea present.  Neck: Normal range of motion. Neck  supple. No thyromegaly present.  Cardiovascular: Normal rate and regular rhythm.  Exam reveals no gallop and no friction rub.   No murmur heard. Pulmonary/Chest: Breath sounds normal. No respiratory distress. She has no wheezes. Right breast exhibits no inverted nipple, no mass, no nipple discharge, no skin change and no tenderness. Left breast exhibits no inverted nipple, no mass, no nipple discharge, no skin change and no tenderness. Breasts are symmetrical.  Abdominal: Soft. Bowel sounds are normal. She exhibits no distension. There is no tenderness.  Genitourinary: Vagina normal and uterus normal. No breast swelling, tenderness or discharge. There is no rash or tenderness on the right labia. There is no rash, tenderness or lesion on the left labia. Uterus is not tender. Cervix exhibits no motion tenderness, no discharge and no friability. Right adnexum displays no mass, no tenderness and no fullness. Left adnexum displays no mass, no tenderness and no fullness. No tenderness in the vagina.  Musculoskeletal: Normal range of motion. She exhibits no edema or tenderness.  Lymphadenopathy:    She has no cervical adenopathy.  Neurological: She is alert and oriented to person, place, and time.  Skin: Skin is warm and dry.  Psychiatric: She has a normal mood and affect. Her behavior is normal. Judgment normal.   Results for orders placed or performed in visit on 12/04/14  Comprehensive Metabolic Panel (CMET)  Result Value Ref Range   Glucose 115 (H) 65 - 99 mg/dL   BUN 30 (H) 6 - 24 mg/dL   Creatinine, Ser 2.31 (H) 0.57 - 1.00 mg/dL   GFR calc non Af Amer 23 (L) >59 mL/min/1.73   GFR calc Af Amer 26 (L) >59 mL/min/1.73   BUN/Creatinine Ratio 13 9 - 23   Sodium 144 134 - 144 mmol/L   Potassium 5.8 (H) 3.5 - 5.2 mmol/L   Chloride 104 97 - 108 mmol/L   CO2 24 18 - 29 mmol/L   Calcium 9.7 8.7 - 10.2 mg/dL   Total Protein 6.8 6.0 - 8.5 g/dL   Albumin 4.3 3.5 - 5.5 g/dL   Globulin, Total 2.5 1.5 -  4.5 g/dL   Albumin/Globulin Ratio 1.7 1.1 - 2.5   Bilirubin Total 0.5 0.0 - 1.2 mg/dL   Alkaline Phosphatase 81 39 - 117 IU/L   AST 9 0 - 40 IU/L   ALT 7 0 - 32 IU/L  Lipid Profile  Result Value Ref Range   Cholesterol, Total 124 100 - 199 mg/dL   Triglycerides 68 0 - 149 mg/dL   HDL 40 >39 mg/dL   VLDL Cholesterol Cal 14 5 - 40 mg/dL   LDL Calculated 70 0 - 99 mg/dL   Chol/HDL Ratio 3.1 0.0 - 4.4 ratio units  Vit D  25 hydroxy (rtn osteoporosis  monitoring)  Result Value Ref Range   Vit D, 25-Hydroxy 21.9 (L) 30.0 - 100.0 ng/mL  TSH  Result Value Ref Range   TSH 0.790 0.450 - 4.500 uIU/mL  Specimen status report  Result Value Ref Range   specimen status report Comment       Assessment & Plan:   Problem List Items Addressed This Visit      Other   Tobacco use    Encouraged pt to quit smoking.        Other Visit Diagnoses    Well woman exam with routine gynecological exam    -  Primary    Relevant Orders    Pap IG and HPV (high risk) DNA detection    Screening for cervical cancer        Relevant Orders    Pap IG and HPV (high risk) DNA detection    Allergic rhinitis due to pollen        Sent in flonase for allergy relief and ear congestion. Trial of afrin for ear congestion.     Relevant Medications    fluticasone (FLONASE) 50 MCG/ACT nasal spray    oxymetazoline (AFRIN NASAL SPRAY) 0.05 % nasal spray       Meds ordered this encounter  Medications  . fluticasone (FLONASE) 50 MCG/ACT nasal spray    Sig: Place 2 sprays into both nostrils daily.    Dispense:  16 g    Refill:  11    Order Specific Question:  Supervising Provider    Answer:  Arlis Porta [phone]  . oxymetazoline (AFRIN NASAL SPRAY) 0.05 % nasal spray    Sig: Place two spray in L nostril at bedtime and allow to dribble into ear. Do for 3 nights only.    Dispense:  30 mL    Refill:  0    Order Specific Question:  Supervising Provider    Answer:  Arlis Porta [id number]       Follow up plan: Return in about 3 months (around 11/27/2015), or if symptoms worsen or fail to improve, for BP.

## 2015-08-29 LAB — PAP IG AND HPV HIGH-RISK
HPV, high-risk: NEGATIVE
PAP SMEAR COMMENT: 0

## 2015-09-04 IMAGING — US US RENAL
1 series · 14 of 25 positions shown · non-contrast
Comparison: None.

CLINICAL DATA: Chronic renal insufficiency stage IV

EXAM:
RENAL / URINARY TRACT ULTRASOUND COMPLETE

[Series 1: us renal · 0.28mm/px · 14 of 35 slices shown]
[im 1/35]
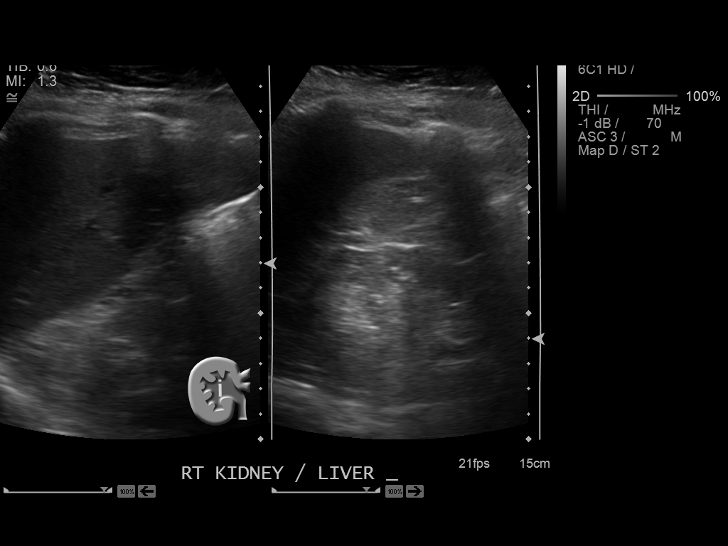
[im 3/35]
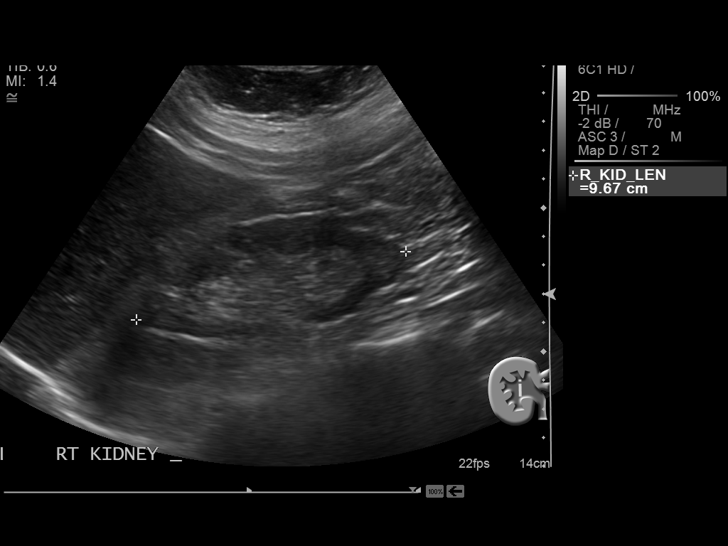
[im 6/35]
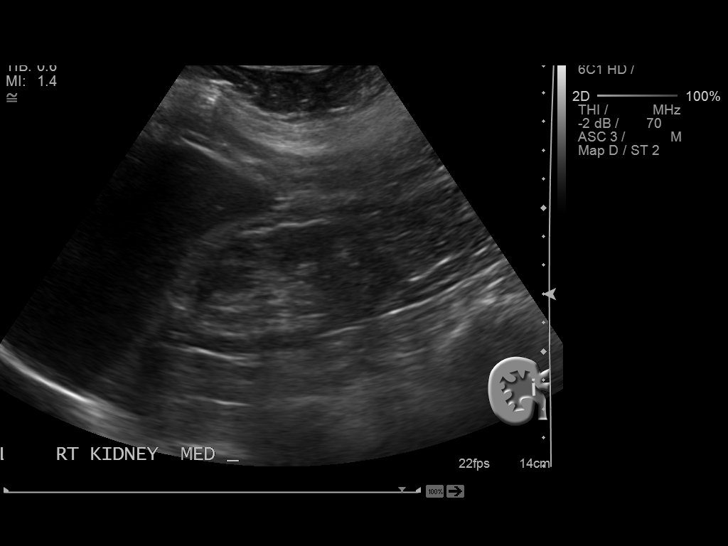
[im 9/35]
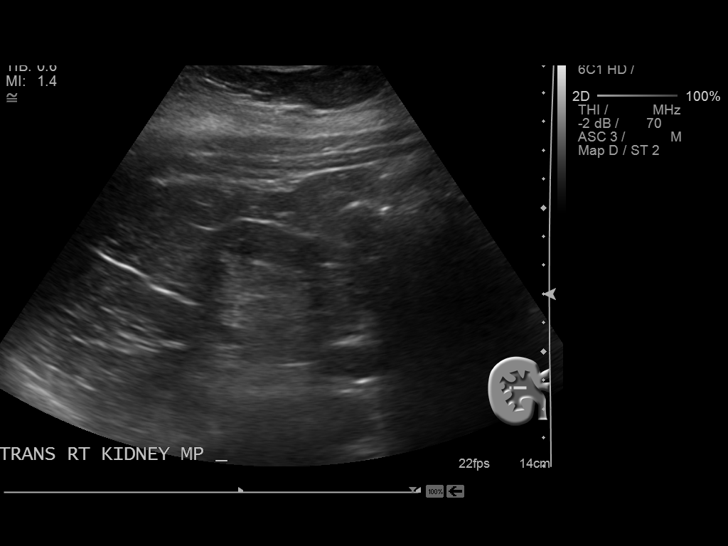
[im 12/35]
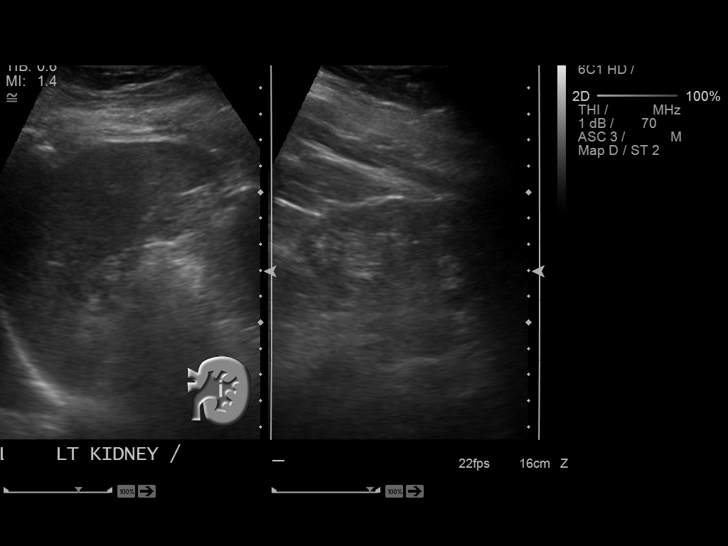
[im 13/35]
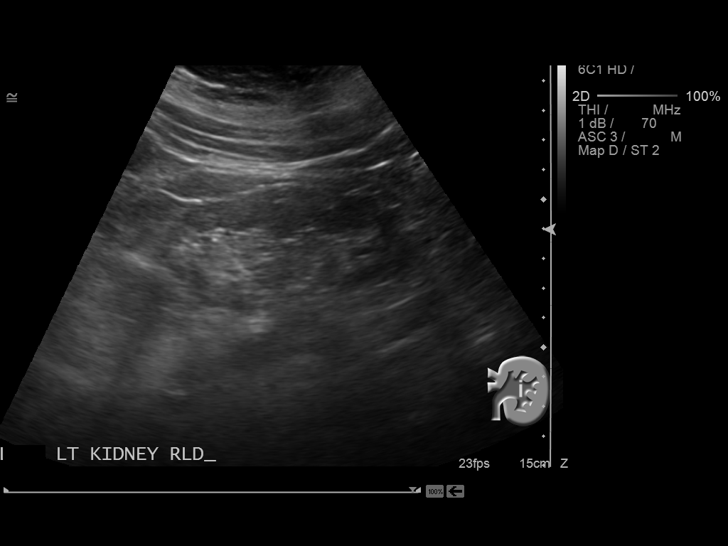
[im 16/35]
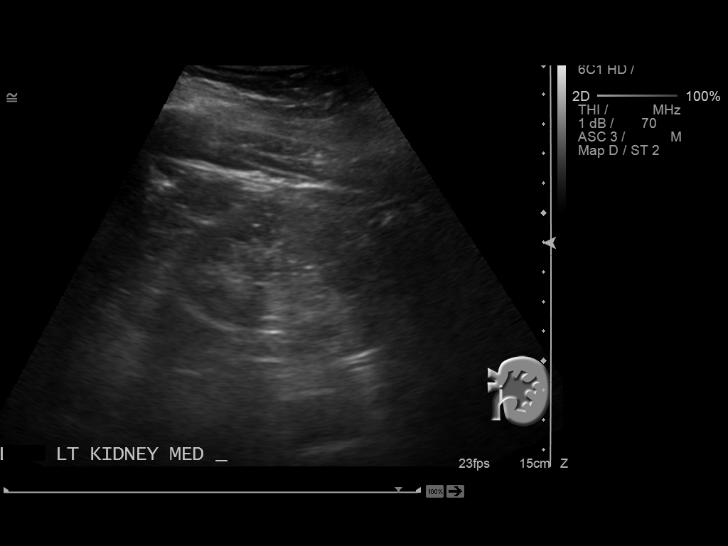
[im 19/35]
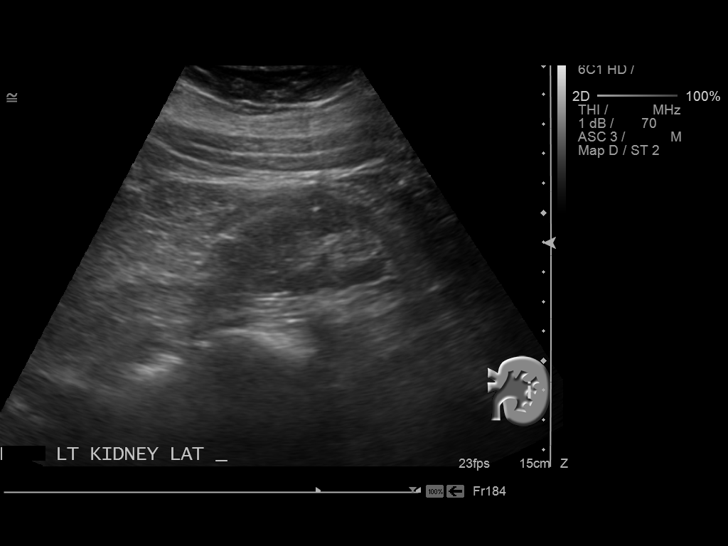
[im 22/35]
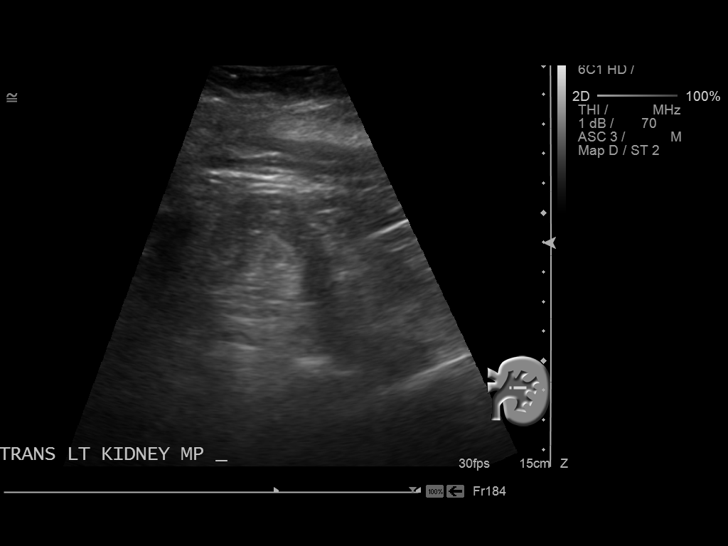
[im 23/35]
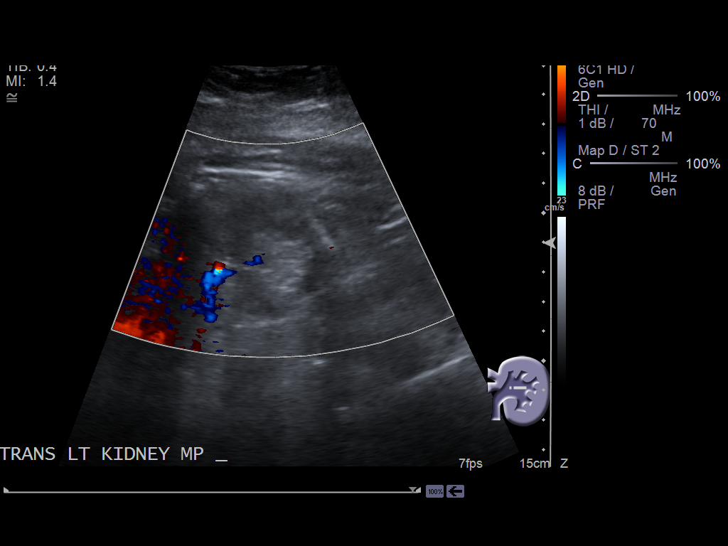
[im 26/35]
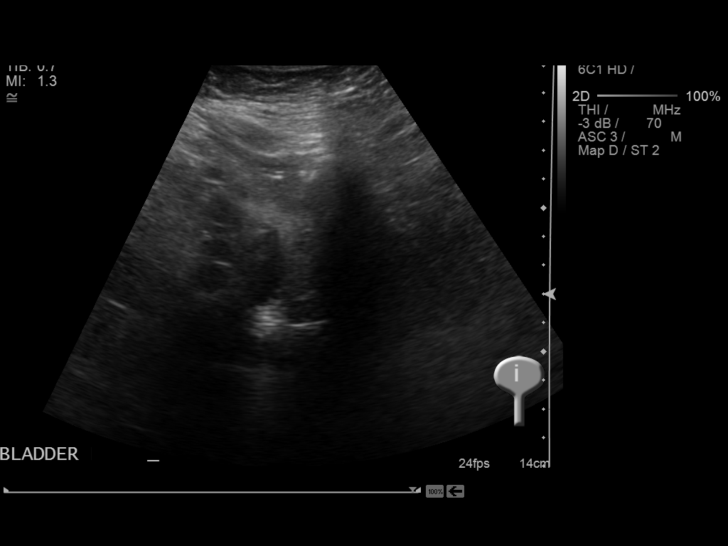
[im 29/35]
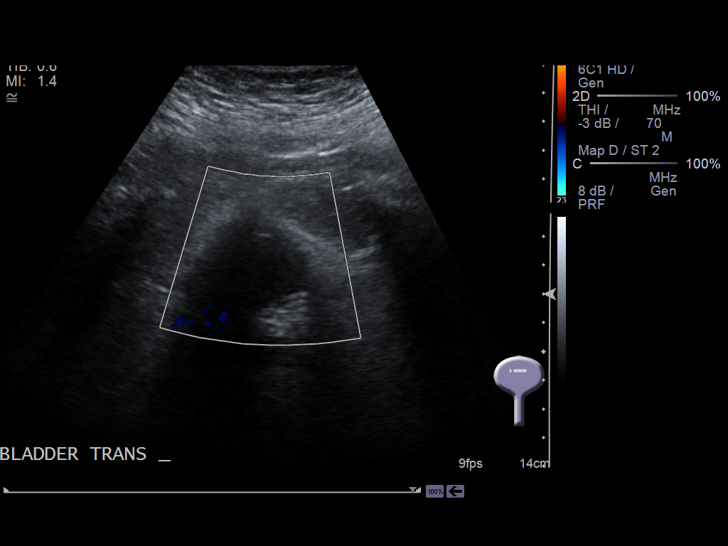
[im 32/35]
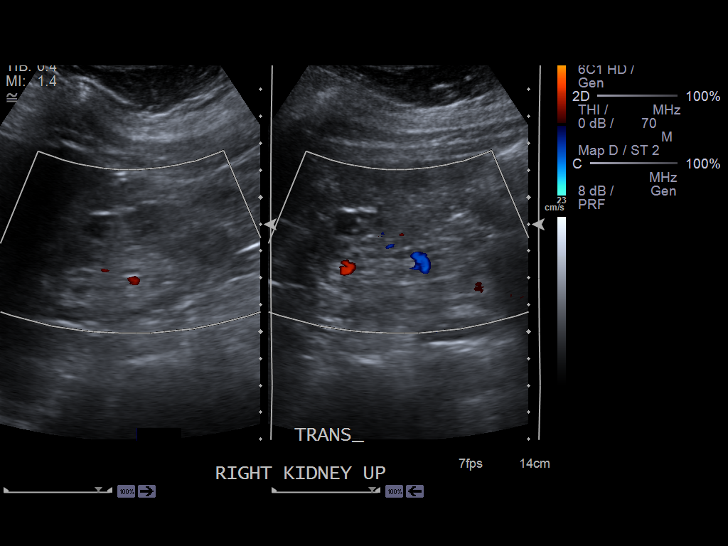
[im 35/35]
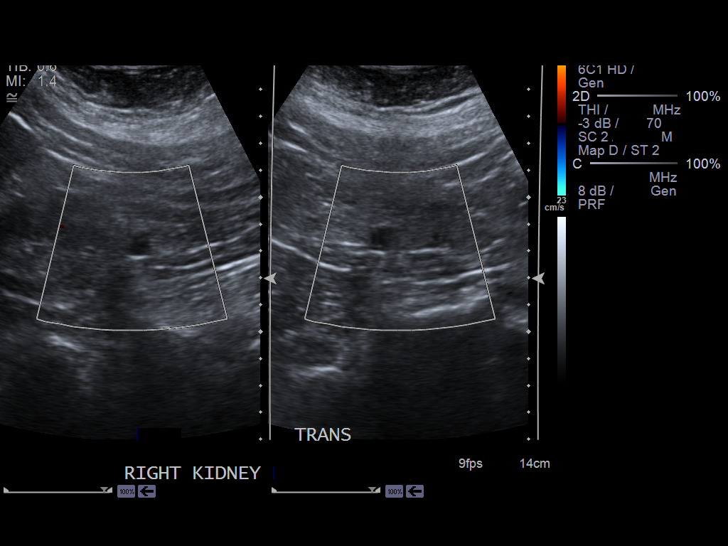

[14 of 25 positions shown; findings below may reference images not displayed]

FINDINGS: Right Kidney:

Length: 9.7 cm. The renal cortical echotexture is approximately
equal to that of the adjacent liver. There is no hydronephrosis.
There is an upper pole cyst measuring 1 cm in diameter. There is a
lower pole cyst measuring 1.1 cm in diameter.

Left Kidney:

Length: 9 cm. The cortical echotexture is diffusely increased. There
is no hydronephrosis.

Bladder:

The urinary bladder was decompressed despite the patient having been
drinking fluid as instructed.
IMPRESSION: 1. Increased renal cortical echotexture bilaterally consistent with
medical renal disease.
2. There is no hydronephrosis.

## 2015-12-05 ENCOUNTER — Other Ambulatory Visit: Payer: Self-pay | Admitting: Family Medicine

## 2015-12-06 ENCOUNTER — Other Ambulatory Visit: Payer: Self-pay | Admitting: Family Medicine

## 2015-12-06 DIAGNOSIS — I10 Essential (primary) hypertension: Secondary | ICD-10-CM

## 2015-12-06 DIAGNOSIS — E785 Hyperlipidemia, unspecified: Secondary | ICD-10-CM

## 2015-12-06 DIAGNOSIS — F418 Other specified anxiety disorders: Secondary | ICD-10-CM

## 2015-12-06 DIAGNOSIS — J302 Other seasonal allergic rhinitis: Secondary | ICD-10-CM

## 2015-12-06 NOTE — Telephone Encounter (Signed)
Explained to patient Vanessa Camacho on vacation. Dr. Luan Pulling has only given patient enough for 3 days.

## 2015-12-06 NOTE — Telephone Encounter (Signed)
Pt went to get medications from [facility] and was only given a 3 day supply of citalopram and metoprolol.  She also needs loratadine and atorvastatin.  Her call back number is [phone]

## 2015-12-09 MED ORDER — METOPROLOL SUCCINATE ER 25 MG PO TB24: 25.0000 mg | ORAL_TABLET | Freq: Every day | ORAL | Status: DC

## 2015-12-09 MED ORDER — ATORVASTATIN CALCIUM 20 MG PO TABS: 20.0000 mg | ORAL_TABLET | Freq: Every day | ORAL | Status: DC

## 2015-12-09 MED ORDER — CITALOPRAM HYDROBROMIDE 20 MG PO TABS: 20.0000 mg | ORAL_TABLET | Freq: Every day | ORAL | Status: DC

## 2015-12-09 MED ORDER — LORATADINE 10 MG PO TABS: 10.0000 mg | ORAL_TABLET | Freq: Every day | ORAL | Status: DC

## 2015-12-23 ENCOUNTER — Other Ambulatory Visit: Payer: Self-pay | Admitting: Family Medicine

## 2016-01-03 ENCOUNTER — Telehealth: Payer: Self-pay | Admitting: Family Medicine

## 2016-01-03 DIAGNOSIS — F418 Other specified anxiety disorders: Secondary | ICD-10-CM

## 2016-01-03 MED ORDER — LORAZEPAM 0.5 MG PO TABS: 0.5000 mg | ORAL_TABLET | Freq: Every day | ORAL | 0 refills | Status: DC

## 2016-01-03 NOTE — Telephone Encounter (Signed)
Please let her know that I normally require patients to pick up controlled medication refills. Because she is out of town, I will fax it today. However, she will need an appt for any further refills of lorazepam. Thanks! AK

## 2016-01-03 NOTE — Telephone Encounter (Signed)
Advised pt she is moving to [place] and getting medicare starting 03/2016 pt advised as per Amy that she can't Rx anymore office visit needed for future refill pt understood very well.

## 2016-01-03 NOTE — Telephone Encounter (Signed)
Pt needs a refill on lorazepam 0.5 mg sent to [facility] in [place] phone [phone].  Her call back number is [phone]

## 2017-01-18 ENCOUNTER — Other Ambulatory Visit: Payer: Self-pay

## 2018-10-11 ENCOUNTER — Ambulatory Visit: Payer: Self-pay | Admitting: Family Medicine

## 2019-02-21 ENCOUNTER — Other Ambulatory Visit: Payer: Self-pay

## 2019-02-21 ENCOUNTER — Encounter: Payer: Self-pay | Admitting: Nurse Practitioner

## 2019-02-21 ENCOUNTER — Ambulatory Visit (INDEPENDENT_AMBULATORY_CARE_PROVIDER_SITE_OTHER): Payer: Medicare HMO | Admitting: Nurse Practitioner

## 2019-02-21 VITALS — BP 126/75 | HR 62 | Ht 64.5 in | Wt 172.8 lb

## 2019-02-21 DIAGNOSIS — Z8659 Personal history of other mental and behavioral disorders: Secondary | ICD-10-CM

## 2019-02-21 DIAGNOSIS — J302 Other seasonal allergic rhinitis: Secondary | ICD-10-CM | POA: Diagnosis not present

## 2019-02-21 DIAGNOSIS — F419 Anxiety disorder, unspecified: Secondary | ICD-10-CM

## 2019-02-21 DIAGNOSIS — E559 Vitamin D deficiency, unspecified: Secondary | ICD-10-CM

## 2019-02-21 DIAGNOSIS — F418 Other specified anxiety disorders: Secondary | ICD-10-CM | POA: Diagnosis not present

## 2019-02-21 DIAGNOSIS — N184 Chronic kidney disease, stage 4 (severe): Secondary | ICD-10-CM

## 2019-02-21 DIAGNOSIS — Z23 Encounter for immunization: Secondary | ICD-10-CM

## 2019-02-21 DIAGNOSIS — E785 Hyperlipidemia, unspecified: Secondary | ICD-10-CM

## 2019-02-21 DIAGNOSIS — E538 Deficiency of other specified B group vitamins: Secondary | ICD-10-CM

## 2019-02-21 DIAGNOSIS — N2581 Secondary hyperparathyroidism of renal origin: Secondary | ICD-10-CM

## 2019-02-21 DIAGNOSIS — I10 Essential (primary) hypertension: Secondary | ICD-10-CM

## 2019-02-21 MED ORDER — ATORVASTATIN CALCIUM 20 MG PO TABS
20.0000 mg | ORAL_TABLET | Freq: Every day | ORAL | 1 refills | Status: DC
Start: 2019-02-21 — End: 2019-08-21

## 2019-02-21 MED ORDER — HYDROXYZINE HCL 10 MG PO TABS: 10.0000 mg | ORAL_TABLET | Freq: Three times a day (TID) | ORAL | 1 refills | Status: DC | PRN

## 2019-02-21 MED ORDER — METOPROLOL SUCCINATE ER 25 MG PO TB24
25.0000 mg | ORAL_TABLET | Freq: Every day | ORAL | 1 refills | Status: DC
Start: 2019-02-21 — End: 2019-08-21

## 2019-02-21 MED ORDER — AMLODIPINE BESYLATE 10 MG PO TABS: 10.0000 mg | ORAL_TABLET | Freq: Every day | ORAL | 1 refills | Status: DC

## 2019-02-21 MED ORDER — LORATADINE 10 MG PO TABS: 10.0000 mg | ORAL_TABLET | Freq: Every day | ORAL | 1 refills | Status: DC

## 2019-02-21 MED ORDER — VITAMIN D (ERGOCALCIFEROL) 1.25 MG (50000 UNIT) PO CAPS: ORAL_CAPSULE | ORAL | 0 refills | Status: DC

## 2019-02-21 MED ORDER — CITALOPRAM HYDROBROMIDE 20 MG PO TABS: 20.0000 mg | ORAL_TABLET | Freq: Every day | ORAL | 1 refills | Status: DC

## 2019-02-21 NOTE — Progress Notes (Signed)
Subjective:    Patient ID: Vanessa Camacho, female    DOB: [birth year], 63 y.o.   MRN: [id number]  Vanessa Camacho is a 63 y.o. female presenting on 02/21/2019 for Establish Care (need referral to Nephrologist )  HPI [place] [facility] last seen by PCP 1 year ago.  Obtain records from [place] for [facility].  She was seen at this clinic last in March 2017 when she lived here in [place] with her daughter.  Patient is relocating from [place] where she lived with another daughter.  Chronic Kidney Disease Nephrologist -most recently seen by [facility] in [facility].  Patient was prior patient of Dr. Juleen China at [facility] in [place]. - Last labs are presented today. - Patient is generally adhering to a Mediterranean diet - low salt, low potassium.  Notes this has been very important to her for keeping stable kidney function labs. - Works to keep BP readings in normal range/controlled.  Anxiety and Depression Patient reports increasing situational anxiety recently.  She has been in [place] for about 18 months and is still waiting for [facility] to help her with affordable housing placement.  She is anxiously awaiting placement at this time, is ready to move from her daughters home.  Her daughter and family are also putting some pressure on her to move out as well.  In general, patient notes she is dealing with baseline stressors well.  Is not doing as well with situational increased stress.  Is using lorazepam 0.5 mg tablets prn for anxiety.  Is relying on this more now than in past.  Has had 30 tablets for past 2 years per PDMP review.  Has not tried hydroxyzine.  Is stable on citalopram 20 mg daily without side effects.  GAD 7 : Generalized Anxiety Score 02/21/2019 02/21/2019 05/06/2015  Nervous, Anxious, on Edge 3 3 2   Control/stop worrying 1 1 1   Worry too much - different things 3 3 0  Trouble relaxing 3 3 3   Restless 2 2 3   Easily annoyed  or irritable 0 0 3  Afraid - awful might happen 2 2 0  Total GAD 7 Score 14 14 12   Anxiety Difficulty Not difficult at all Somewhat difficult Somewhat difficult     Depression screen [facility] 2/9 02/21/2019 02/21/2019 05/06/2015 05/06/2015 01/11/2015  Decreased Interest 1 1 1  0 0  Down, Depressed, Hopeless 0 0 1 - 0  PHQ - 2 Score 1 1 2  0 0  Altered sleeping 2 2 3  - -  Tired, decreased energy 0 0 2 - -  Change in appetite 1 1 3  - -  Feeling bad or failure about yourself  1 1 0 - -  Trouble concentrating 0 0 1 - -  Moving slowly or fidgety/restless 2 2 2  - -  Suicidal thoughts 0 0 0 - -  PHQ-9 Score 7 7 13  - -  Difficult doing work/chores Somewhat difficult Somewhat difficult Somewhat difficult - -     Past Medical History:  Diagnosis Date  . Allergy   . Anxiety   . Arthritis   . Chronic kidney disease    stage 4 renal failure past urologist was in [place]  . Depression   . Hypertension   . Stroke ([place])   . Urinary incontinence    Past Surgical History:  Procedure Laterality Date  . CHOLECYSTECTOMY     Social History   Socioeconomic History  . Marital status: Married  Spouse name: Not on file  . Number of children: Not on file  . Years of education: Not on file  . Highest education level: Not on file  Occupational History  . Not on file  Social Needs  . Financial resource strain: Not on file  . Food insecurity    Worry: Not on file    Inability: Not on file  . Transportation needs    Medical: Not on file    Non-medical: Not on file  Tobacco Use  . Smoking status: Current Some Day Smoker    Packs/day: 0.25    Years: 40.00    Pack years: 10.00    Types: Cigarettes    Last attempt to quit: 01/01/2015    Years since quitting: 4.1  . Smokeless tobacco: Never Used  Substance and Sexual Activity  . Alcohol use: Not Currently    Alcohol/week: 0.0 standard drinks  . Drug use: Yes    Frequency: 3.0 times per week    Types: Marijuana    Comment: enjoyment  . Sexual  activity: Not on file  Lifestyle  . Physical activity    Days per week: 7 days    Minutes per session: 20 min  . Stress: Not on file  Relationships  . Social Herbalist on phone: Not on file    Gets together: Not on file    Attends religious service: Not on file    Active member of club or organization: Not on file    Attends meetings of clubs or organizations: Not on file    Relationship status: Not on file  . Intimate partner violence    Fear of current or ex partner: Not on file    Emotionally abused: Not on file    Physically abused: Not on file    Forced sexual activity: Not on file  Other Topics Concern  . Not on file  Social History Narrative  . Not on file   Family History  Problem Relation Age of Onset  . Stroke Mother   . Dementia Mother   . Hypertension Mother   . Cancer Father   . Brain cancer Father   . Skin cancer Sister    Current Outpatient Medications on File Prior to Visit  Medication Sig  . aspirin 81 MG chewable tablet Chew by mouth daily.  . fluticasone (FLONASE) 50 MCG/ACT nasal spray Place 2 sprays into both nostrils daily.  Marland Kitchen LORazepam (ATIVAN) 0.5 MG tablet Take 1 tablet (0.5 mg total) by mouth at bedtime. (Patient taking differently: Take 0.5 mg by mouth daily as needed. )  . ondansetron (ZOFRAN) 4 MG tablet Take 4 mg by mouth every 8 (eight) hours as needed for nausea or vomiting.   No current facility-administered medications on file prior to visit.     Review of Systems  Constitutional: Negative for chills and fever.  HENT: Negative for congestion and sore throat.   Eyes: Negative for pain.  Respiratory: Negative for cough, shortness of breath and wheezing.   Cardiovascular: Negative for chest pain, palpitations and leg swelling.  Gastrointestinal: Negative for abdominal pain, blood in stool, constipation, diarrhea, nausea and vomiting.  Endocrine: Negative for polydipsia.  Genitourinary: Negative for dysuria, frequency,  hematuria and urgency.  Musculoskeletal: Negative for back pain, myalgias and neck pain.  Skin: Negative.  Negative for rash.  Allergic/Immunologic: Negative for environmental allergies.  Neurological: Negative for dizziness, weakness and headaches.  Hematological: Does not bruise/bleed easily.  Psychiatric/Behavioral: Negative for  dysphoric mood and suicidal ideas. The patient is nervous/anxious and is hyperactive.    Per HPI unless specifically indicated above     Objective:    BP 126/75 (BP Location: Left Arm, Patient Position: Sitting, Cuff Size: Normal)   Pulse 62   Ht 5' 4.5" (1.638 m)   Wt 172 lb 12.8 oz (78.4 kg)   BMI 29.20 kg/m   Wt Readings from Last 3 Encounters:  02/21/19 172 lb 12.8 oz (78.4 kg)  08/27/15 201 lb 6.4 oz (91.4 kg)  05/06/15 198 lb 3.2 oz (89.9 kg)    Physical Exam Vitals signs reviewed.  Constitutional:      General: She is not in acute distress.    Appearance: She is well-developed.  HENT:     Head: Normocephalic and atraumatic.  Cardiovascular:     Rate and Rhythm: Normal rate and regular rhythm.     Pulses:          Radial pulses are 2+ on the right side and 2+ on the left side.       Posterior tibial pulses are 1+ on the right side and 1+ on the left side.     Heart sounds: Normal heart sounds, S1 normal and S2 normal.  Pulmonary:     Effort: Pulmonary effort is normal. No respiratory distress.     Breath sounds: Normal breath sounds and air entry.  Musculoskeletal:     Right lower leg: No edema.     Left lower leg: No edema.  Skin:    General: Skin is warm and dry.     Capillary Refill: Capillary refill takes less than 2 seconds.  Neurological:     Mental Status: She is alert and oriented to person, place, and time.  Psychiatric:        Attention and Perception: Attention normal.        Mood and Affect: Affect normal. Mood is anxious.        Speech: Speech is rapid and pressured.        Behavior: Behavior normal. Behavior is  cooperative.        Thought Content: Thought content normal. Thought content does not include homicidal or suicidal ideation. Thought content does not include homicidal or suicidal plan.        Cognition and Memory: Cognition normal.        Judgment: Judgment normal.    Lab 21 Nov 2018 08 Oct 2017  Potassium 4.4 4.3  Creatinine 2.66 2.88  GFR 18.1 16.6  Calcium 9.4 9.5  Phosphorous 3.7 4.6  PTH - Intact 154 181  Vitamin D 25 17.1 28.4  Protein/Cr ratio 0.15 0.10  Hemoglobin  15.1 14.5       Assessment & Plan:   Problem List Items Addressed This Visit      Cardiovascular and Mediastinum   Essential hypertension Stable on BP reading today.  Patient taking medications as prescribed and tolerates them well.  Needs refills and are provided today.  Labs needed - draw CMP today.  FOLLOW-UP 3 months.   Relevant Medications   atorvastatin (LIPITOR) 20 MG tablet   amLODipine (NORVASC) 10 MG tablet   metoprolol succinate (TOPROL-XL) 25 MG 24 hr tablet   Other Relevant Orders   Comprehensive metabolic panel     Genitourinary   CKD (chronic kidney disease) stage 4, GFR 15-29 ml/min (HCC) Secondary hyperparathyroidism Stable at last labs with normal electrolytes, Calcium, kidney function.  Last labs over 3 months ago.  Needs  nephrology referral and repeat labs today.  Follow-up 3 months and with nephrology.   Relevant Medications   amLODipine (NORVASC) 10 MG tablet   Other Relevant Orders   Ambulatory referral to Nephrology   Comprehensive metabolic panel   VITAMIN D 25 Hydroxy (Vit-D Deficiency, Fractures)   Parathyroid hormone, intact (no Ca)     Other   Seasonal allergies Stable today on exam.  Medications tolerated without side effects.  Continue at current doses.  Refills provided.   . Followup 1 year.    Relevant Medications   loratadine (CLARITIN) 10 MG tablet   Anxiety associated with depression Per patient - stable and controlled.  Per PHQ and GAD scores, moderate  severity with situational worsening of anxiety.  Patient with history of panic attacks, but only rarely occur.  Plan: 1. STOP lorazepam 2. START hydroxyzine 10-20 mg three times daily prn anxiety.  Can increase dose in future if tolerated.  Cautioned drowsiness. 3. Continue citalopram - consider future dose increase to 40 mg if tolerated. 4. Follow-up 3 months.   Relevant Medications   hydrOXYzine (ATARAX/VISTARIL) 10 MG tablet   citalopram (CELEXA) 20 MG tablet   Hyperlipidemia Status unknown.  Recheck labs.  Continue meds without changes today.  Refills provided. Followup 3 months and prn after labs.    Relevant Medications   atorvastatin (LIPITOR) 20 MG tablet   amLODipine (NORVASC) 10 MG tablet   metoprolol succinate (TOPROL-XL) 25 MG 24 hr tablet   Other Relevant Orders   Lipid panel   Comprehensive metabolic panel   123456 deficiency Status unknown.  Recheck labs.  Continue meds without changes today.  Refills provided. Followup 6 months and prn after labs.    Relevant Orders   B12   History of bulimia Encouraged patient to avoid overeating tendencies.  Continue ondansetron prn due to long history of bulimia and occasional unintended n/v.    Other Visit Diagnoses    Anxiety    -  Primary See anxiety and depression above.   Relevant Medications   hydrOXYzine (ATARAX/VISTARIL) 10 MG tablet   citalopram (CELEXA) 20 MG tablet   Vitamin D deficiency     Patient is taking once monthly prescription Vitamin D replacement.  NO repeat in last 3 months.  Needs labs today.  Refill provided for additional 3 tabs ergocalciferol as planned by nephrologist.     Relevant Medications   Vitamin D, Ergocalciferol, (DRISDOL) 1.25 MG (50000 UT) CAPS capsule   Other Relevant Orders   VITAMIN D 25 Hydroxy (Vit-D Deficiency, Fractures)   Need for immunization against influenza       Relevant Orders   Flu Vaccine QUAD 6+ mos PF IM (Fluarix Quad PF) (Completed)      Meds ordered this encounter   Medications  . hydrOXYzine (ATARAX/VISTARIL) 10 MG tablet    Sig: Take 1-2 tablets (10-20 mg total) by mouth 3 (three) times daily as needed for anxiety.    Dispense:  30 tablet    Refill:  1    Order Specific Question:   Supervising Provider    Answer:   Olin Hauser [[id number]]  . atorvastatin (LIPITOR) 20 MG tablet    Sig: Take 1 tablet (20 mg total) by mouth daily.    Dispense:  90 tablet    Refill:  1    Order Specific Question:   Supervising Provider    Answer:   Olin Hauser [[id number]]  . citalopram (CELEXA) 20 MG tablet    Sig:  Take 1 tablet (20 mg total) by mouth daily.    Dispense:  90 tablet    Refill:  1    Order Specific Question:   Supervising Provider    Answer:   Olin Hauser [[id number]]  . loratadine (CLARITIN) 10 MG tablet    Sig: Take 1 tablet (10 mg total) by mouth daily.    Dispense:  90 tablet    Refill:  1    Order Specific Question:   Supervising Provider    Answer:   Olin Hauser [[id number]]  . amLODipine (NORVASC) 10 MG tablet    Sig: Take 1 tablet (10 mg total) by mouth daily.    Dispense:  90 tablet    Refill:  1    Order Specific Question:   Supervising Provider    Answer:   Olin Hauser [[id number]]  . metoprolol succinate (TOPROL-XL) 25 MG 24 hr tablet    Sig: Take 1 tablet (25 mg total) by mouth daily.    Dispense:  90 tablet    Refill:  1    Order Specific Question:   Supervising Provider    Answer:   Olin Hauser [[id number]]  . Vitamin D, Ergocalciferol, (DRISDOL) 1.25 MG (50000 UT) CAPS capsule    Sig: TAKE 1 CAPSULE BY MOUTH MONTHLY    Dispense:  3 capsule    Refill:  0    Order Specific Question:   Supervising Provider    Answer:   Olin Hauser [[id number]]     Follow up plan: Return in about 3 months (around 05/23/2019) for hypertension, anxiety.  Cassell Smiles, DNP, AGPCNP-BC Adult Gerontology Primary Care Nurse Practitioner [facility] 02/21/2019, 10:45 AM

## 2019-02-21 NOTE — Patient Instructions (Addendum)
Duane Lope,   Thank you for coming in to clinic today.  1. Continue all medications without changes except for lorazepam.  _ STOP lorazepam.   _ Try taking hydroxyzine.  START hydroxyzine 10 mg tablet.  Take 1-2 - 1 tablet (10-20 mg) up to three times daily as needed for anxiety or insomnia.  Please schedule a follow-up appointment with Cassell Smiles, AGNP. Return in about 3 months (around 05/23/2019) for hypertension, anxiety.  If you have any other questions or concerns, please feel free to call the clinic or send a message through [place]. You may also schedule an earlier appointment if necessary.  You will receive a survey after today's visit either digitally by e-mail or paper by [employer]. Your experiences and feedback matter to [place].  Please respond so we know how we are doing as we provide care for you.   Cassell Smiles, DNP, AGNP-BC Adult Gerontology Nurse Practitioner [place]

## 2019-02-22 LAB — PARATHYROID HORMONE, INTACT (NO CA): PTH: 215 pg/mL — ABNORMAL HIGH (ref 14–64)

## 2019-02-22 LAB — VITAMIN D 25 HYDROXY (VIT D DEFICIENCY, FRACTURES): Vit D, 25-Hydroxy: 38 ng/mL (ref 30–100)

## 2019-02-22 LAB — COMPREHENSIVE METABOLIC PANEL
AG Ratio: 1.6 (calc) (ref 1.0–2.5)
ALT: 8 U/L (ref 6–29)
AST: 11 U/L (ref 10–35)
Albumin: 4.4 g/dL (ref 3.6–5.1)
Alkaline phosphatase (APISO): 83 U/L (ref 37–153)
BUN/Creatinine Ratio: 12 (calc) (ref 6–22)
BUN: 28 mg/dL — ABNORMAL HIGH (ref 7–25)
CO2: 27 mmol/L (ref 20–32)
Calcium: 10 mg/dL (ref 8.6–10.4)
Chloride: 106 mmol/L (ref 98–110)
Creat: 2.3 mg/dL — ABNORMAL HIGH (ref 0.50–0.99)
Globulin: 2.8 g/dL (calc) (ref 1.9–3.7)
Glucose, Bld: 109 mg/dL — ABNORMAL HIGH (ref 65–99)
Potassium: 5 mmol/L (ref 3.5–5.3)
Sodium: 140 mmol/L (ref 135–146)
Total Bilirubin: 0.5 mg/dL (ref 0.2–1.2)
Total Protein: 7.2 g/dL (ref 6.1–8.1)

## 2019-02-22 LAB — LIPID PANEL
Cholesterol: 121 mg/dL (ref <200)
HDL: 53 mg/dL (ref >=50)
LDL Cholesterol (Calc): 52 mg/dL (calc)
Non-HDL Cholesterol (Calc): 68 mg/dL (calc) (ref <130)
Total CHOL/HDL Ratio: 2.3 (calc) (ref <5.0)
Triglycerides: 76 mg/dL (ref <150)

## 2019-02-22 LAB — VITAMIN B12: Vitamin B-12: 411 pg/mL (ref 200–1100)

## 2019-04-10 DIAGNOSIS — E875 Hyperkalemia: Secondary | ICD-10-CM | POA: Insufficient documentation

## 2019-04-10 DIAGNOSIS — I129 Hypertensive chronic kidney disease with stage 1 through stage 4 chronic kidney disease, or unspecified chronic kidney disease: Secondary | ICD-10-CM | POA: Insufficient documentation

## 2019-04-10 DIAGNOSIS — R809 Proteinuria, unspecified: Secondary | ICD-10-CM | POA: Insufficient documentation

## 2019-05-29 ENCOUNTER — Ambulatory Visit: Payer: Medicare HMO | Admitting: Nurse Practitioner

## 2019-05-29 ENCOUNTER — Ambulatory Visit: Payer: Medicare HMO | Admitting: Family Medicine

## 2019-06-06 ENCOUNTER — Ambulatory Visit (INDEPENDENT_AMBULATORY_CARE_PROVIDER_SITE_OTHER): Payer: Medicare HMO | Admitting: Family Medicine

## 2019-06-06 ENCOUNTER — Encounter: Payer: Self-pay | Admitting: Family Medicine

## 2019-06-06 ENCOUNTER — Other Ambulatory Visit: Payer: Self-pay

## 2019-06-06 VITALS — BP 110/70 | Ht 64.5 in | Wt 177.0 lb

## 2019-06-06 DIAGNOSIS — F418 Other specified anxiety disorders: Secondary | ICD-10-CM

## 2019-06-06 MED ORDER — BUSPIRONE HCL 5 MG PO TABS: 5.0000 mg | ORAL_TABLET | Freq: Two times a day (BID) | ORAL | 2 refills | Status: DC | PRN

## 2019-06-06 NOTE — Patient Instructions (Addendum)
   Please schedule a Follow-up Appointment to: Return in about 3 months (around 09/04/2019) for 3 month anxiety mood w/ new provider.  If you have any other questions or concerns, please feel free to call the office or send a message through [place]. You may also schedule an earlier appointment if necessary.  Additionally, you may be receiving a survey about your experience at our office within a few days to 1 week by e-mail or mail. We value your feedback.  Nobie Putnam, DO [place]

## 2019-06-06 NOTE — Progress Notes (Signed)
Virtual Visit via Telephone The purpose of this virtual visit is to provide medical care while limiting exposure to the novel coronavirus (COVID19) for both patient and office staff.  Consent was obtained for phone visit:  Yes.   Answered questions that patient had about telehealth interaction:  Yes.   I discussed the limitations, risks, security and privacy concerns of performing an evaluation and management service by telephone. I also discussed with the patient that there may be a patient responsible charge related to this service. The patient expressed understanding and agreed to proceed.  Patient Location: Home Provider Location: [facility])  ---------------------------------------------------------------------- Chief Complaint  Patient presents with  . Hypertension    S: Reviewed CMA documentation. I have called patient and gathered additional HPI as follows:  FOLLOW-UP Anxiety and Depression - Last visit with previous PCP 02/2019, for same problem, treated with changing Lorazepam , see prior notes for background information. - Interval update with doing fairly well overall, still significant life stressors, did not tolerate hydroxyzine as well because of sedation - Today patient reports doing well overall, still affected by anxiety depression. She is rarely taking Lorazepam still. Taking Citalopram 20mg  dailiy not interested in dose increase. Prefers to avoid sedating medication - Also has a cat, awaiting to live in her new apartment [place], waiting on treating it for roaches at the moment. Causing her more stress.  Denies any high risk travel to areas of current concern for COVID19. Denies any known or suspected exposure to person with or possibly with COVID19.  Denies any fevers, chills, sweats, body ache, cough, shortness of breath, sinus pain or pressure, headache, abdominal pain, diarrhea  Past Medical History:  Diagnosis Date  . Allergy    . Anxiety   . Arthritis   . Chronic kidney disease    stage 4 renal failure past urologist was in [place]  . Depression   . Hypertension   . Stroke ([place])   . Urinary incontinence    Social History   Tobacco Use  . Smoking status: Current Some Day Smoker    Packs/day: 0.25    Years: 40.00    Pack years: 10.00    Types: Cigarettes    Last attempt to quit: 01/01/2015    Years since quitting: 4.4  . Smokeless tobacco: Never Used  Substance Use Topics  . Alcohol use: Yes    Alcohol/week: 0.0 standard drinks    Comment: occassional  . Drug use: Yes    Frequency: 3.0 times per week    Types: Marijuana    Comment: enjoyment    Current Outpatient Medications:  .  amLODipine (NORVASC) 10 MG tablet, Take 1 tablet (10 mg total) by mouth daily., Disp: 90 tablet, Rfl: 1 .  aspirin 81 MG chewable tablet, Chew by mouth daily., Disp: , Rfl:  .  atorvastatin (LIPITOR) 20 MG tablet, Take 1 tablet (20 mg total) by mouth daily., Disp: 90 tablet, Rfl: 1 .  citalopram (CELEXA) 20 MG tablet, Take 1 tablet (20 mg total) by mouth daily., Disp: 90 tablet, Rfl: 1 .  fluticasone (FLONASE) 50 MCG/ACT nasal spray, Place 2 sprays into both nostrils daily. (Patient taking differently: Place 2 sprays into both nostrils as needed. ), Disp: 16 g, Rfl: 11 .  loratadine (CLARITIN) 10 MG tablet, Take 1 tablet (10 mg total) by mouth daily., Disp: 90 tablet, Rfl: 1 .  LORazepam (ATIVAN) 0.5 MG tablet, Take 1 tablet (0.5 mg total) by mouth at bedtime. (Patient  taking differently: Take 0.5 mg by mouth daily as needed. ), Disp: 30 tablet, Rfl: 0 .  metoprolol succinate (TOPROL-XL) 25 MG 24 hr tablet, Take 1 tablet (25 mg total) by mouth daily., Disp: 90 tablet, Rfl: 1 .  ondansetron (ZOFRAN) 4 MG tablet, Take 4 mg by mouth every 8 (eight) hours as needed for nausea or vomiting., Disp: , Rfl:  .  ASPIRIN 81 PO, Take by mouth., Disp: , Rfl:  .  busPIRone (BUSPAR) 5 MG tablet, Take 1 tablet (5 mg total) by mouth 2 (two)  times daily as needed (anxiety)., Disp: 60 tablet, Rfl: 2 .  Vitamin D, Ergocalciferol, (DRISDOL) 1.25 MG (50000 UT) CAPS capsule, TAKE 1 CAPSULE BY MOUTH MONTHLY (Patient not taking: Reported on 06/06/2019), Disp: 3 capsule, Rfl: 0  Depression screen [facility] 2/9 06/06/2019 02/21/2019 02/21/2019  Decreased Interest 0 1 1  Down, Depressed, Hopeless 2 0 0  PHQ - 2 Score 2 1 1   Altered sleeping 1 2 2   Tired, decreased energy 0 0 0  Change in appetite 0 1 1  Feeling bad or failure about yourself  0 1 1  Trouble concentrating 0 0 0  Moving slowly or fidgety/restless 2 2 2   Suicidal thoughts 0 0 0  PHQ-9 Score 5 7 7   Difficult doing work/chores Not difficult at all Somewhat difficult Somewhat difficult    GAD 7 : Generalized Anxiety Score 06/06/2019 02/21/2019 02/21/2019 05/06/2015  Nervous, Anxious, on Edge 3 3 3 2   Control/stop worrying 0 1 1 1   Worry too much - different things 2 3 3  0  Trouble relaxing 0 3 3 3   Restless 0 2 2 3   Easily annoyed or irritable 2 0 0 3  Afraid - awful might happen 0 2 2 0  Total GAD 7 Score 7 14 14 12   Anxiety Difficulty Somewhat difficult Not difficult at all Somewhat difficult Somewhat difficult    -------------------------------------------------------------------------- O: No physical exam performed due to remote telephone encounter.  Lab results reviewed.  No results found for this or any previous visit (from the past 2160 hour(s)).  -------------------------------------------------------------------------- A&P:  Problem List Items Addressed This Visit    Anxiety associated with depression - Primary   Relevant Medications   busPIRone (BUSPAR) 5 MG tablet     Moderately controlled depression and anxiety Limited BDZ use, limiting sedation now, off hydroxyzine Continue Citalopram 20mg  daily, offered to increase, she declines now.  Add new Buspar 5mg  BID PRN for now as trial for anxiety can take daily or PRN - adjust dose accordingly  Meds  ordered this encounter  Medications  . busPIRone (BUSPAR) 5 MG tablet    Sig: Take 1 tablet (5 mg total) by mouth 2 (two) times daily as needed (anxiety).    Dispense:  60 tablet    Refill:  2    Follow-up: - Return in 3 months for anxiety / mood w/ new provider  Patient verbalizes understanding with the above medical recommendations including the limitation of remote medical advice.  Specific follow-up and call-back criteria were given for patient to follow-up or seek medical care more urgently if needed.   - Time spent in direct consultation with patient on phone: 9 minutes   Nobie Putnam, [facility] 06/06/2019, 10:32 AM

## 2019-06-28 ENCOUNTER — Other Ambulatory Visit: Payer: Self-pay | Admitting: Family Medicine

## 2019-06-28 DIAGNOSIS — F418 Other specified anxiety disorders: Secondary | ICD-10-CM

## 2019-08-03 ENCOUNTER — Ambulatory Visit (INDEPENDENT_AMBULATORY_CARE_PROVIDER_SITE_OTHER): Payer: Medicare HMO

## 2019-08-03 ENCOUNTER — Ambulatory Visit
Admission: EM | Admit: 2019-08-03 | Discharge: 2019-08-03 | Disposition: A | Discharge status: Home / Self Care | Payer: Medicare HMO | Attending: Family Medicine | Admitting: Family Medicine

## 2019-08-03 ENCOUNTER — Other Ambulatory Visit: Payer: Self-pay

## 2019-08-03 ENCOUNTER — Encounter: Payer: Self-pay | Admitting: Emergency Medicine

## 2019-08-03 DIAGNOSIS — E860 Dehydration: Secondary | ICD-10-CM

## 2019-08-03 DIAGNOSIS — K529 Noninfective gastroenteritis and colitis, unspecified: Secondary | ICD-10-CM | POA: Insufficient documentation

## 2019-08-03 DIAGNOSIS — R112 Nausea with vomiting, unspecified: Secondary | ICD-10-CM | POA: Diagnosis not present

## 2019-08-03 LAB — COMPREHENSIVE METABOLIC PANEL
ALT: 17 U/L (ref 0–44)
AST: 22 U/L (ref 15–41)
Albumin: 4.9 g/dL (ref 3.5–5.0)
Alkaline Phosphatase: 80 U/L (ref 38–126)
Anion gap: 13 (ref 5–15)
BUN: 28 mg/dL — ABNORMAL HIGH (ref 8–23)
CO2: 24 mmol/L (ref 22–32)
Calcium: 9.9 mg/dL (ref 8.9–10.3)
Chloride: 99 mmol/L (ref 98–111)
Creatinine, Ser: 2.14 mg/dL — ABNORMAL HIGH (ref 0.44–1.00)
GFR calc Af Amer: 28 mL/min — ABNORMAL LOW (ref >60)
GFR calc non Af Amer: 24 mL/min — ABNORMAL LOW (ref >60)
Glucose, Bld: 128 mg/dL — ABNORMAL HIGH (ref 70–99)
Potassium: 3.9 mmol/L (ref 3.5–5.1)
Sodium: 136 mmol/L (ref 135–145)
Total Bilirubin: 1.1 mg/dL (ref 0.3–1.2)
Total Protein: 8.4 g/dL — ABNORMAL HIGH (ref 6.5–8.1)

## 2019-08-03 LAB — CBC WITH DIFFERENTIAL/PLATELET
Abs Immature Granulocytes: 0.07 10*3/uL (ref 0.00–0.07)
Basophils Absolute: 0 10*3/uL (ref 0.0–0.1)
Basophils Relative: 0 %
Eosinophils Absolute: 0 10*3/uL (ref 0.0–0.5)
Eosinophils Relative: 0 %
HCT: 46.4 % — ABNORMAL HIGH (ref 36.0–46.0)
Hemoglobin: 15.3 g/dL — ABNORMAL HIGH (ref 12.0–15.0)
Immature Granulocytes: 1 %
Lymphocytes Relative: 10 %
Lymphs Abs: 1.5 10*3/uL (ref 0.7–4.0)
MCH: 30.1 pg (ref 26.0–34.0)
MCHC: 33 g/dL (ref 30.0–36.0)
MCV: 91.2 fL (ref 80.0–100.0)
Monocytes Absolute: 0.7 10*3/uL (ref 0.1–1.0)
Monocytes Relative: 5 %
Neutro Abs: 12.5 10*3/uL — ABNORMAL HIGH (ref 1.7–7.7)
Neutrophils Relative %: 84 %
Platelets: 181 10*3/uL (ref 150–400)
RBC: 5.09 MIL/uL (ref 3.87–5.11)
RDW: 12.5 % (ref 11.5–15.5)
WBC: 14.8 10*3/uL — ABNORMAL HIGH (ref 4.0–10.5)
nRBC: 0 % (ref 0.0–0.2)

## 2019-08-03 MED ORDER — ONDANSETRON HCL 4 MG PO TABS: 4.0000 mg | ORAL_TABLET | Freq: Three times a day (TID) | ORAL | 0 refills | Status: DC | PRN

## 2019-08-03 MED ORDER — ONDANSETRON HCL 4 MG/2ML IJ SOLN
8.0000 mg | Freq: Once | INTRAMUSCULAR | Status: AC
  Administered 2019-08-03: 8 mg via INTRAVENOUS

## 2019-08-03 MED ORDER — SODIUM CHLORIDE 0.9 % IV BOLUS
500.0000 mL | Freq: Once | INTRAVENOUS | Status: AC
  Administered 2019-08-03: 11:00:00 500 mL via INTRAVENOUS

## 2019-08-03 NOTE — ED Triage Notes (Signed)
Patient c/o vomiting and diarrhea that started yesterday. She denies any other symptoms.

## 2019-08-03 NOTE — ED Provider Notes (Signed)
[facility]    CSN: [id number] Arrival date & time: 08/03/19  1014      History   Chief Complaint Chief Complaint  Patient presents with  . Emesis  . Diarrhea  . Abdominal Pain    HPI  64 year old female with a past medical history of hypertension, stroke, CKD stage IV presents with nausea, vomiting, and diarrhea.  Patient states that her symptoms started yesterday.  She reports nausea, vomiting, diarrhea.  Has continued to persist.  She reports some associated abdominal pain from vomiting.  She states that she has been unable to keep anything down.  She is concerned that she may be getting dehydrated.  She is very concerned given her history of kidney disease.  No known inciting factor.  No known exacerbating factors.  It was this is yellow in coloration.  She has some ongoing cough as well.  She is a current smoker.  No relieving factors.  No other complaints.  Past Medical History:  Diagnosis Date  . Allergy   . Anxiety   . Arthritis   . Chronic kidney disease    stage 4 renal failure past urologist was in [place]  . Depression   . Hypertension   . Stroke ([place])   . Urinary incontinence     Patient Active Problem List   Diagnosis Date Noted  . History of bulimia 02/21/2019  . Secondary hyperparathyroidism of renal origin ([place]) 02/21/2019  . B12 deficiency 03/05/2015  . Loss of memory 02/14/2015  . H/O: stroke with residual effects 12/04/2014  . Essential hypertension 12/04/2014  . CKD (chronic kidney disease) stage 4, GFR 15-29 ml/min (HCC) 12/04/2014  . Seasonal allergies 12/04/2014  . Anxiety associated with depression 12/04/2014  . Hyperlipidemia 12/04/2014  . Tobacco use 12/04/2014    Past Surgical History:  Procedure Laterality Date  . CHOLECYSTECTOMY      OB History   No obstetric history on file.      Home Medications    Prior to Admission medications   Medication Sig Start Date End Date Taking? Authorizing Provider  amLODipine  (NORVASC) 10 MG tablet Take 1 tablet (10 mg total) by mouth daily. 02/21/19  Yes Mikey College, NP  aspirin 81 MG chewable tablet Chew by mouth daily.   Yes [provider]  atorvastatin (LIPITOR) 20 MG tablet Take 1 tablet (20 mg total) by mouth daily. 02/21/19  Yes Mikey College, NP  busPIRone (BUSPAR) 5 MG tablet TAKE 1 TABLET (5 MG TOTAL) BY MOUTH 2 (TWO) TIMES DAILY AS NEEDED (ANXIETY). 06/28/19  Yes Karamalegos, Devonne Doughty, DO  citalopram (CELEXA) 20 MG tablet Take 1 tablet (20 mg total) by mouth daily. 02/21/19  Yes Mikey College, NP  hydrOXYzine (ATARAX/VISTARIL) 10 MG tablet Take 10 mg by mouth 3 (three) times daily as needed.   Yes [provider]  loratadine (CLARITIN) 10 MG tablet Take 1 tablet (10 mg total) by mouth daily. 02/21/19  Yes Mikey College, NP  metoprolol succinate (TOPROL-XL) 25 MG 24 hr tablet Take 1 tablet (25 mg total) by mouth daily. 02/21/19  Yes Mikey College, NP  Vitamin D, Ergocalciferol, (DRISDOL) 1.25 MG (50000 UT) CAPS capsule TAKE 1 CAPSULE BY MOUTH MONTHLY 02/21/19  Yes Mikey College, NP  ASPIRIN 81 PO Take by mouth.    [provider]  ondansetron (ZOFRAN) 4 MG tablet Take 1 tablet (4 mg total) by mouth every 8 (eight) hours as needed for nausea or vomiting. 08/03/19  Myracle Febres G, DO  fluticasone (FLONASE) 50 MCG/ACT nasal spray Place 2 sprays into both nostrils daily. Patient taking differently: Place 2 sprays into both nostrils as needed.  08/27/15 08/03/19  Luciana Axe, NP    Family History Family History  Problem Relation Age of Onset  . Stroke Mother   . Dementia Mother   . Hypertension Mother   . Cancer Father   . Brain cancer Father   . Skin cancer Sister     Social History Social History   Tobacco Use  . Smoking status: Current Some Day Smoker    Packs/day: 0.25    Years: 40.00    Pack years: 10.00    Types: Cigarettes    Last attempt to quit: 01/01/2015     Years since quitting: 4.5  . Smokeless tobacco: Never Used  Substance Use Topics  . Alcohol use: Yes    Alcohol/week: 0.0 standard drinks    Comment: occassional  . Drug use: Yes    Frequency: 3.0 times per week    Types: Marijuana    Comment: enjoyment     Allergies   Patient has no known allergies.   Review of Systems Review of Systems  Constitutional: Positive for appetite change.  Gastrointestinal: Positive for abdominal pain, diarrhea, nausea and vomiting.   Physical Exam Triage Vital Signs ED Triage Vitals  Enc Vitals Group     BP 08/03/19 1027 (!) 143/94     Pulse Rate 08/03/19 1027 72     Resp 08/03/19 1027 18     Temp 08/03/19 1027 98.2 F (36.8 C)     Temp Source 08/03/19 1027 Oral     SpO2 08/03/19 1027 97 %     Weight 08/03/19 1028 178 lb (80.7 kg)     Height 08/03/19 1028 5\' 6"  (1.676 m)     Head Circumference --      Peak Flow --      Pain Score 08/03/19 1026 5     Pain Loc --      Pain Edu? --      Excl. in [place]? --    Updated Vital Signs BP (!) 143/94 (BP Location: Right Arm)   Pulse 72   Temp 98.2 F (36.8 C) (Oral)   Resp 18   Ht 5\' 6"  (1.676 m)   Wt 80.7 kg   SpO2 97%   BMI 28.73 kg/m   Visual Acuity Right Eye Distance:   Left Eye Distance:   Bilateral Distance:    Right Eye Near:   Left Eye Near:    Bilateral Near:     Physical Exam Vitals and nursing note reviewed.  Constitutional:      Comments: Chronically ill-appearing female no acute distress.  Patient is currently vomiting.  HENT:     Head: Normocephalic and atraumatic.     Mouth/Throat:     Mouth: Mucous membranes are dry.  Eyes:     General:        Right eye: No discharge.        Left eye: No discharge.     Conjunctiva/sclera: Conjunctivae normal.  Cardiovascular:     Rate and Rhythm: Normal rate and regular rhythm.  Pulmonary:     Effort: Pulmonary effort is normal. No respiratory distress.  Abdominal:     General: There is no distension.     Palpations:  Abdomen is soft.     Tenderness: There is no abdominal tenderness. There is no guarding or rebound.  Neurological:     Mental Status: She is alert.  Psychiatric:        Mood and Affect: Mood normal.        Behavior: Behavior normal.    UC Treatments / Results  Labs (all labs ordered are listed, but only abnormal results are displayed) Labs Reviewed  CBC WITH DIFFERENTIAL/PLATELET - Abnormal; Notable for the following components:      Result Value   WBC 14.8 (*)    Hemoglobin 15.3 (*)    HCT 46.4 (*)    Neutro Abs 12.5 (*)    All other components within normal limits  COMPREHENSIVE METABOLIC PANEL - Abnormal; Notable for the following components:   Glucose, Bld 128 (*)    BUN 28 (*)    Creatinine, Ser 2.14 (*)    Total Protein 8.4 (*)    GFR calc non Af Amer 24 (*)    GFR calc Af Amer 28 (*)    All other components within normal limits    EKG   Radiology DG Abd 1 View  Result Date: 08/03/2019 CLINICAL DATA:  Vomiting and diarrhea EXAM: ABDOMEN - 1 VIEW COMPARISON:  None. FINDINGS: There is a mild volume of stool in the colon. There is no bowel dilatation or air-fluid level to suggest bowel obstruction. No free air. There are multiple vascular calcifications in the pelvis and left upper quadrant. There are surgical clips in the gallbladder fossa region. Lung bases are clear. IMPRESSION: No bowel obstruction or free air.  Lung bases clear. Electronically Signed   By: Lowella Grip III M.D.   On: 08/03/2019 11:53    Procedures Procedures (including critical care time)  Medications Ordered in UC Medications  ondansetron (ZOFRAN) injection 8 mg (8 mg Intravenous Given 08/03/19 1052)  sodium chloride 0.9 % bolus 500 mL (500 mLs Intravenous New Bag/Given 08/03/19 1052)    Initial Impression / Assessment and Plan / UC Course  I have reviewed the triage vital signs and the nursing notes.  Pertinent labs & imaging results that were available during my care of the patient  were reviewed by me and considered in my medical decision making (see chart for details).    64 year old female presents with acute onset nausea, vomiting, diarrhea.  Complicated patient.  Patient has underlying chronic kidney disease.  Laboratory studies were reviewed from her nephrologist.  Most recent CBC on 2/8 was normal.  Most recent metabolic panel on 2/8 revealed a creatinine of 2.12 and a BUN of 31.  Laboratory studies obtained today and revealed mild hemoconcentration consistent with dehydration and mild leukocytosis.  Creatinine was at baseline, 2.14.  Patient clinically dehydrated.  IV fluids were given.  Patient given IV Zofran for nausea with resolution and vomiting.  Yellow coloration of emesis was concerning for bilious emesis and KUB was obtained.  KUB was independently reviewed by me.  Interpretation: Minimal stool noted.  No evidence of bowel obstruction.  Negative KUB.  Patient will be discharged home on Zofran.  Advised fluids and supportive care.  Final Clinical Impressions(s) / UC Diagnoses   Final diagnoses:  Nausea and vomiting  Gastroenteritis  Dehydration     Discharge Instructions     Rest.  Fluids.  Medication as directed.  If you worsen, go to the hospital.  Take care  Dr. Lacinda Axon     ED Prescriptions    Medication Sig [place]. Provider   ondansetron (ZOFRAN) 4 MG tablet Take 1 tablet (4 mg total) by mouth every  8 (eight) hours as needed for nausea or vomiting. 20 tablet Coral Spikes, DO     PDMP not reviewed this encounter.   Coral Spikes, [place] 08/03/19 1207

## 2019-08-03 NOTE — Discharge Instructions (Signed)
Rest.  Fluids.  Medication as directed.  If you worsen, go to the hospital.  Take care  Dr. Lacinda Axon

## 2019-08-18 ENCOUNTER — Other Ambulatory Visit: Payer: Self-pay | Admitting: Nurse Practitioner

## 2019-08-18 DIAGNOSIS — F418 Other specified anxiety disorders: Secondary | ICD-10-CM

## 2019-08-18 DIAGNOSIS — J302 Other seasonal allergic rhinitis: Secondary | ICD-10-CM

## 2019-08-19 ENCOUNTER — Other Ambulatory Visit: Payer: Self-pay | Admitting: Nurse Practitioner

## 2019-08-19 DIAGNOSIS — I10 Essential (primary) hypertension: Secondary | ICD-10-CM

## 2019-08-19 DIAGNOSIS — E785 Hyperlipidemia, unspecified: Secondary | ICD-10-CM

## 2019-10-17 ENCOUNTER — Other Ambulatory Visit: Payer: Self-pay | Admitting: Family Medicine

## 2019-10-17 ENCOUNTER — Telehealth: Payer: Self-pay | Admitting: *Deleted

## 2019-10-17 ENCOUNTER — Encounter: Payer: Self-pay | Admitting: Family Medicine

## 2019-10-17 ENCOUNTER — Other Ambulatory Visit: Payer: Self-pay

## 2019-10-17 ENCOUNTER — Ambulatory Visit (INDEPENDENT_AMBULATORY_CARE_PROVIDER_SITE_OTHER): Payer: Medicare HMO | Admitting: Family Medicine

## 2019-10-17 VITALS — BP 129/84 | HR 61 | Temp 97.1°F | Resp 18 | Ht 66.0 in | Wt 181.0 lb

## 2019-10-17 DIAGNOSIS — I10 Essential (primary) hypertension: Secondary | ICD-10-CM

## 2019-10-17 DIAGNOSIS — Z1231 Encounter for screening mammogram for malignant neoplasm of breast: Secondary | ICD-10-CM

## 2019-10-17 DIAGNOSIS — F418 Other specified anxiety disorders: Secondary | ICD-10-CM

## 2019-10-17 DIAGNOSIS — J302 Other seasonal allergic rhinitis: Secondary | ICD-10-CM

## 2019-10-17 MED ORDER — CITALOPRAM HYDROBROMIDE 20 MG PO TABS: 20.0000 mg | ORAL_TABLET | Freq: Every day | ORAL | 1 refills | Status: DC

## 2019-10-17 NOTE — Assessment & Plan Note (Signed)
Reported controlled hypertension with slight elevation in clinic today.  BP is slightly above goal < 130/80.  Pt is working on lifestyle modifications.  Taking medications tolerating well without side effects.   Plan: 1. Continue taking metoprolol succinate (Toprol XL) 25mg  daily 2. Obtain labs before your next follow up appointment  3. Encouraged heart healthy diet and increasing exercise to 30 minutes most days of the week, going no more than 2 days in a row without exercise. 4. Check BP 1-2 x per week at home, keep log, and bring to clinic at next appointment. 5. Follow up 3 months.

## 2019-10-17 NOTE — Telephone Encounter (Signed)
Scheduled OV for today @11 :00am. Approved 90 day supply.

## 2019-10-17 NOTE — Assessment & Plan Note (Signed)
Currently well controlled with citalopram 20mg  daily with buspirone 5mg  twice daily PRN.  Reports has not taken the buspirone in months and believes is well controlled.    Plan: 1. Continue citalopram 20mg  daily 2. Continue buspirone 5mg  twice per day as needed for anxiety. 3. Follow up in 6 months.

## 2019-10-17 NOTE — Patient Instructions (Signed)
Your medication refills have been sent to your pharmacy on file.  Try to get exercise a minimum of 30 minutes per day at least 5 days per week as well as  adequate water intake all while measuring blood pressure a few times per week.  Keep a blood pressure log and bring back to clinic at your next visit.  If your readings are consistently over 130/80 to contact our office/send me a MyChart message and we will see you sooner.  Can try DASH and Mediterranean diet options, avoiding processed foods, lowering sodium intake, avoiding pork products, and eating a plant based diet for optimal health.  For Mammogram screening for breast cancer   Call the [facility] below anytime to schedule your own appointment now that order has been placed.  [facility] [place], [place] [address] Phone: [phone]  [facility] [address] [place],  [address] Phone: [phone]       Mediterranean Diet  Why follow it? Research shows. . Those who follow the Mediterranean diet have a reduced risk of heart disease  . The diet is associated with a reduced incidence of Parkinson's and Alzheimer's diseases . People following the diet may have longer life expectancies and lower rates of chronic diseases  . The Dietary Guidelines for Americans recommends the Mediterranean diet as an eating plan to promote health and prevent disease  What Is the Mediterranean Diet?  . Healthy eating plan based on typical foods and recipes of Mediterranean-style cooking . The diet is primarily a plant based diet; these foods should make up a majority of meals   Starches - Plant based foods should make up a majority of meals - They are an important sources of vitamins, minerals, energy, antioxidants, and fiber - Choose whole grains, foods high in fiber and minimally processed items  - Typical grain sources include wheat, oats, barley,  corn, brown rice, bulgar, farro, millet, polenta, couscous  - Various types of beans include chickpeas, lentils, fava beans, black beans, white beans   Fruits  Veggies - Large quantities of antioxidant rich fruits & veggies; 6 or more servings  - Vegetables can be eaten raw or lightly drizzled with oil and cooked  - Vegetables common to the traditional Mediterranean Diet include: artichokes, arugula, beets, broccoli, brussel sprouts, cabbage, carrots, celery, collard greens, cucumbers, eggplant, kale, leeks, lemons, lettuce, mushrooms, okra, onions, peas, peppers, potatoes, pumpkin, radishes, rutabaga, shallots, spinach, sweet potatoes, turnips, zucchini - Fruits common to the Mediterranean Diet include: apples, apricots, avocados, cherries, clementines, dates, figs, grapefruits, grapes, melons, nectarines, oranges, peaches, pears, pomegranates, strawberries, tangerines  Fats - Replace butter and margarine with healthy oils, such as olive oil, canola oil, and tahini  - Limit nuts to no more than a handful a day  - Nuts include walnuts, almonds, pecans, pistachios, pine nuts  - Limit or avoid candied, honey roasted or heavily salted nuts - Olives are central to the [employer] - can be eaten whole or used in a variety of dishes   Meats Protein - Limiting red meat: no more than a few times a month - When eating red meat: choose lean cuts and keep the portion to the size of deck of cards - Eggs: approx. 0 to 4 times a week  - Fish and lean poultry: at least 2 a week  - Healthy protein sources include, chicken, [place], lean beef, lamb - Increase intake of seafood such  as tuna, salmon, trout, mackerel, shrimp, scallops - Avoid or limit high fat processed meats such as sausage and bacon  Dairy - Include moderate amounts of low fat dairy products  - Focus on healthy dairy such as fat free yogurt, skim milk, low or reduced fat cheese - Limit dairy products higher in fat such as whole or 2% milk,  cheese, ice cream  Alcohol - Moderate amounts of red wine is ok  - No more than 5 oz daily for women (all ages) and men older than age 83  - No more than 10 oz of wine daily for men younger than 45  Other - Limit sweets and other desserts  - Use herbs and spices instead of salt to flavor foods  - Herbs and spices common to the traditional Mediterranean Diet include: basil, bay leaves, chives, cloves, cumin, fennel, garlic, lavender, marjoram, mint, oregano, parsley, pepper, rosemary, sage, savory, sumac, tarragon, thyme   It's not just a diet, it's a lifestyle:  . The Mediterranean diet includes lifestyle factors typical of those in the region  . Foods, drinks and meals are best eaten with others and savored . Daily physical activity is important for overall good health . This could be strenuous exercise like running and aerobics . This could also be more leisurely activities such as walking, housework, yard-work, or taking the stairs . Moderation is the key; a balanced and healthy diet accommodates most foods and drinks . Consider portion sizes and frequency of consumption of certain foods   Meal Ideas & Options:  . Breakfast:  o Whole wheat toast or whole wheat English muffins with peanut butter & hard boiled egg o Steel cut oats topped with apples & cinnamon and skim milk  o Fresh fruit: banana, strawberries, melon, berries, peaches  o Smoothies: strawberries, bananas, greek yogurt, peanut butter o Low fat greek yogurt with blueberries and granola  o Egg white omelet with spinach and mushrooms o Breakfast couscous: whole wheat couscous, apricots, skim milk, cranberries  . Sandwiches:  o Hummus and grilled vegetables (peppers, zucchini, squash) on whole wheat bread   o Grilled chicken on whole wheat pita with lettuce, tomatoes, cucumbers or tzatziki  o Tuna salad on whole wheat bread: tuna salad made with greek yogurt, olives, red peppers, capers, green onions o Garlic rosemary lamb  pita: lamb sauted with garlic, rosemary, salt & pepper; add lettuce, cucumber, greek yogurt to pita - flavor with lemon juice and black pepper  . Seafood:  o Mediterranean grilled salmon, seasoned with garlic, basil, parsley, lemon juice and black pepper o Shrimp, lemon, and spinach whole-grain pasta salad made with low fat greek yogurt  o Seared scallops with lemon orzo  o Seared tuna steaks seasoned salt, pepper, coriander topped with tomato mixture of olives, tomatoes, olive oil, minced garlic, parsley, green onions and cappers  . Meats:  o Herbed greek chicken salad with kalamata olives, cucumber, feta  o Red bell peppers stuffed with spinach, bulgur, lean ground beef (or lentils) & topped with feta   o Kebabs: skewers of chicken, tomatoes, onions, zucchini, squash  o [place] burgers: made with red onions, mint, dill, lemon juice, feta cheese topped with roasted red peppers . Vegetarian o Cucumber salad: cucumbers, artichoke hearts, celery, red onion, feta cheese, tossed in olive oil & lemon juice  o Hummus and whole grain pita points with a greek salad (lettuce, tomato, feta, olives, cucumbers, red onion) o Lentil soup with celery, carrots made with vegetable broth,  garlic, salt and pepper  o Tabouli salad: parsley, bulgur, mint, scallions, cucumbers, tomato, radishes, lemon juice, olive oil, salt and pepper.  We will plan to see you back in 3 months for hypertension, depression follow up and PAP testing  You will receive a survey after today's visit either digitally by e-mail or paper by [place] mail. Your experiences and feedback matter to [place].  Please respond so we know how we are doing as we provide care for you.  Call us with any questions/concerns/needs.  It is my goal to be available to you for your health concerns.  Thanks for choosing me to be a partner in your healthcare needs!  Harlin Rain, FNP-C Family Nurse Practitioner [facility] Phone: [phone]

## 2019-10-17 NOTE — Telephone Encounter (Signed)
Follow up appointment needed before further prescription refills. LOV  06/06/19.

## 2019-10-17 NOTE — Progress Notes (Signed)
Subjective:    Patient ID: Vanessa Camacho, female    DOB: 05/13/1956, 64 y.o.   MRN: [id number]  Vanessa Camacho is a 64 y.o. female presenting on 10/17/2019 for Anxiety (depression, need refills)   HPI  Ms. Neubert presents to clinic for follow up on her anxiety and hypertension, requesting refills.  Reports well controlled anxiety with citalopram and has buspirone for PRN anxiety that she has not used in > 2 months.  Denies any acute concerns.  Hypertension - She is not checking BP at home or outside of clinic.    - Current medications: metoprolol succinate (Toprol XL) 25mg  daily, tolerating well without side effects - She is not currently symptomatic. - Pt denies headache, lightheadedness, dizziness, changes in vision, chest tightness/pressure, palpitations, leg swelling, sudden loss of speech or loss of consciousness. - She  reports no regular exercise routine. - Her diet is moderate in salt, moderate in fat, and moderate in carbohydrates.   Depression screen [facility] Iv 2/9 10/17/2019 06/06/2019 02/21/2019  Decreased Interest 0 0 1  Down, Depressed, Hopeless 0 2 0  PHQ - 2 Score 0 2 1  Altered sleeping 3 1 2   Tired, decreased energy 0 0 0  Change in appetite 0 0 1  Feeling bad or failure about yourself  0 0 1  Trouble concentrating 0 0 0  Moving slowly or fidgety/restless 0 2 2  Suicidal thoughts 0 0 0  PHQ-9 Score 3 5 7   Difficult doing work/chores Not difficult at all Not difficult at all Somewhat difficult    Social History   Tobacco Use  . Smoking status: Current Some Day Smoker    Packs/day: 0.25    Years: 40.00    Pack years: 10.00    Types: Cigarettes    Last attempt to quit: 01/01/2015    Years since quitting: 4.7  . Smokeless tobacco: Never Used  Substance Use Topics  . Alcohol use: Yes    Alcohol/week: 0.0 standard drinks    Comment: occassional  . Drug use: Yes    Frequency: 3.0 times per week    Types: Marijuana    Comment: enjoyment    Review of Systems   Constitutional: Negative.   HENT: Negative.   Eyes: Negative.   Respiratory: Negative.   Cardiovascular: Negative.   Gastrointestinal: Negative.   Endocrine: Negative.   Genitourinary: Negative.   Musculoskeletal: Negative.   Skin: Negative.   Allergic/Immunologic: Negative.   Neurological: Negative.   Hematological: Negative.   Psychiatric/Behavioral: Negative.    Per HPI unless specifically indicated above     Objective:    BP 129/84   Pulse 61   Temp (!) 97.1 F (36.2 C) (Temporal)   Resp 18   Ht 5\' 6"  (1.676 m)   Wt 181 lb (82.1 kg)   SpO2 97%   BMI 29.21 kg/m   Wt Readings from Last 3 Encounters:  10/17/19 181 lb (82.1 kg)  08/03/19 178 lb (80.7 kg)  06/06/19 177 lb (80.3 kg)    Physical Exam Vitals reviewed.  Constitutional:      General: She is not in acute distress.    Appearance: Normal appearance. She is obese. She is not ill-appearing or toxic-appearing.  HENT:     Head: Normocephalic.  Eyes:     General: Lids are normal. Vision grossly intact.        Right eye: No discharge.        Left eye: No discharge.  Extraocular Movements: Extraocular movements intact.     Conjunctiva/sclera: Conjunctivae normal.     Pupils: Pupils are equal, round, and reactive to light.  Cardiovascular:     Rate and Rhythm: Normal rate and regular rhythm.     Pulses: Normal pulses.          Dorsalis pedis pulses are 2+ on the right side and 2+ on the left side.     Heart sounds: Normal heart sounds. No murmur. No friction rub. No gallop.   Pulmonary:     Effort: Pulmonary effort is normal. No respiratory distress.     Breath sounds: Normal breath sounds.  Musculoskeletal:     Right lower leg: No edema.     Left lower leg: No edema.  Feet:     Right foot:     Skin integrity: Skin integrity normal.     Left foot:     Skin integrity: Skin integrity normal.  Skin:    General: Skin is warm and dry.     Capillary Refill: Capillary refill takes less than 2 seconds.   Neurological:     General: No focal deficit present.     Mental Status: She is alert and oriented to person, place, and time.     Cranial Nerves: No cranial nerve deficit.     Sensory: No sensory deficit.     Motor: No weakness.     Coordination: Coordination normal.     Gait: Gait normal.  Psychiatric:        Attention and Perception: Attention and perception normal.        Mood and Affect: Mood and affect normal.        Speech: Speech normal.        Behavior: Behavior normal. Behavior is cooperative.        Thought Content: Thought content normal.        Cognition and Memory: Cognition and memory normal.        Judgment: Judgment normal.     Results for orders placed or performed during the hospital encounter of 08/03/19  CBC with Differential  Result Value Ref Range   WBC 14.8 (H) 4.0 - 10.5 K/uL   RBC 5.09 3.87 - 5.11 MIL/uL   Hemoglobin 15.3 (H) 12.0 - 15.0 g/dL   HCT 46.4 (H) 36.0 - 46.0 %   MCV 91.2 80.0 - 100.0 fL   MCH 30.1 26.0 - 34.0 pg   MCHC 33.0 30.0 - 36.0 g/dL   RDW 12.5 11.5 - 15.5 %   Platelets 181 150 - 400 K/uL   nRBC 0.0 0.0 - 0.2 %   Neutrophils Relative % 84 %   Neutro Abs 12.5 (H) 1.7 - 7.7 K/uL   Lymphocytes Relative 10 %   Lymphs Abs 1.5 0.7 - 4.0 K/uL   Monocytes Relative 5 %   Monocytes Absolute 0.7 0.1 - 1.0 K/uL   Eosinophils Relative 0 %   Eosinophils Absolute 0.0 0.0 - 0.5 K/uL   Basophils Relative 0 %   Basophils Absolute 0.0 0.0 - 0.1 K/uL   Immature Granulocytes 1 %   Abs Immature Granulocytes 0.07 0.00 - 0.07 K/uL  Comprehensive metabolic panel  Result Value Ref Range   Sodium 136 135 - 145 mmol/L   Potassium 3.9 3.5 - 5.1 mmol/L   Chloride 99 98 - 111 mmol/L   CO2 24 22 - 32 mmol/L   Glucose, Bld 128 (H) 70 - 99 mg/dL   BUN 28 (H) 8 -  23 mg/dL   Creatinine, Ser 2.14 (H) 0.44 - 1.00 mg/dL   Calcium 9.9 8.9 - 10.3 mg/dL   Total Protein 8.4 (H) 6.5 - 8.1 g/dL   Albumin 4.9 3.5 - 5.0 g/dL   AST 22 15 - 41 U/L   ALT 17 0 - 44  U/L   Alkaline Phosphatase 80 38 - 126 U/L   Total Bilirubin 1.1 0.3 - 1.2 mg/dL   GFR calc non Af Amer 24 (L) >60 mL/min   GFR calc Af Amer 28 (L) >60 mL/min   Anion gap 13 5 - 15      Assessment & Plan:   Problem List Items Addressed This Visit      Cardiovascular and Mediastinum   Essential hypertension    Reported controlled hypertension with slight elevation in clinic today.  BP is slightly above goal < 130/80.  Pt is working on lifestyle modifications.  Taking medications tolerating well without side effects.   Plan: 1. Continue taking metoprolol succinate (Toprol XL) 25mg  daily 2. Obtain labs before your next follow up appointment  3. Encouraged heart healthy diet and increasing exercise to 30 minutes most days of the week, going no more than 2 days in a row without exercise. 4. Check BP 1-2 x per week at home, keep log, and bring to clinic at next appointment. 5. Follow up 3 months.           Other   Anxiety associated with depression    Currently well controlled with citalopram 20mg  daily with buspirone 5mg  twice daily PRN.  Reports has not taken the buspirone in months and believes is well controlled.    Plan: 1. Continue citalopram 20mg  daily 2. Continue buspirone 5mg  twice per day as needed for anxiety. 3. Follow up in 6 months.      Relevant Medications   citalopram (CELEXA) 20 MG tablet    Other Visit Diagnoses    Encounter for screening mammogram for malignant neoplasm of breast    -  Primary   Relevant Orders   MM 3D SCREEN BREAST BILATERAL      Meds ordered this encounter  Medications  . citalopram (CELEXA) 20 MG tablet    Sig: Take 1 tablet (20 mg total) by mouth daily.    Dispense:  90 tablet    Refill:  1      Follow up plan: Return in about 3 months (around 01/17/2020) for HTN, Depression and PAP testing.   Harlin Rain, [place] [facility] 10/17/2019, 11:24 AM

## 2019-10-17 NOTE — Telephone Encounter (Signed)
Requested medication (s) are due for refill today: yes  Requested medication (s) are on the active medication list: yes  Last refill:  08/18/19  Future visit scheduled: no  Notes to clinic:  Called pt to schedule appt. Pt stated that she was evaluated by jer nephrologist and stated she didn't think she needs to come in. Advised pt that she needs appointment and to establish care with new provider at [facility]. Advised pt that will send note to providers at office.    Requested Prescriptions  Pending Prescriptions Disp Refills   loratadine (CLARITIN) 10 MG tablet [Pharmacy Med Name: LORATADINE 10 MG TABLET] 60 tablet 0    Sig: TAKE 1 TABLET BY MOUTH EVERY DAY      Ear, Nose, and Throat:  Antihistamines Failed - 10/17/2019  9:31 AM      Failed - Valid encounter within last 12 months    Recent Outpatient Visits           4 months ago Anxiety associated with depression   [place], DO   7 months ago [facility] Merrilyn Puma, Jerrel Ivory, NP   4 years ago Well woman exam with routine gynecological exam   [facility] Vincenza Hews, Amy Lauren, NP   4 years ago Benign hypertension with chronic kidney disease, stage IV [facility])   [facility] Vincenza Hews, Amy Lauren, NP   4 years ago CKD (chronic kidney disease) stage 4, GFR 15-29 ml/min   [facility] Vincenza Hews, Genevie Cheshire, NP

## 2019-10-19 ENCOUNTER — Ambulatory Visit: Payer: Medicare HMO | Attending: Internal Medicine

## 2019-10-19 DIAGNOSIS — Z23 Encounter for immunization: Secondary | ICD-10-CM

## 2019-10-19 NOTE — Progress Notes (Signed)
   Covid-19 Vaccination Clinic  Name:  Vanessa Camacho    MRN: [id number] DOB: [birth year]  10/19/2019  [name] was observed post Covid-19 immunization for 15 minutes without incident. She was provided with Vaccine Information Sheet and instruction to access the V-Safe system.   [name] was instructed to call 911 with any severe reactions post vaccine: Marland Kitchen Difficulty breathing  . Swelling of face and throat  . A fast heartbeat  . A bad rash all over body  . Dizziness and weakness   Immunizations Administered    Name Date Dose VIS Date Route   Moderna COVID-19 Vaccine 10/19/2019 12:04 PM 0.5 mL 05/2019 Intramuscular   Manufacturer: Moderna   Lot: [id number]   [place]: [id number]

## 2019-10-24 ENCOUNTER — Other Ambulatory Visit: Payer: Self-pay | Admitting: Nurse Practitioner

## 2019-10-24 DIAGNOSIS — N184 Chronic kidney disease, stage 4 (severe): Secondary | ICD-10-CM

## 2019-10-24 DIAGNOSIS — I10 Essential (primary) hypertension: Secondary | ICD-10-CM

## 2019-11-16 ENCOUNTER — Ambulatory Visit: Payer: Medicare HMO | Attending: Internal Medicine

## 2019-11-16 DIAGNOSIS — Z23 Encounter for immunization: Secondary | ICD-10-CM

## 2019-11-16 NOTE — Progress Notes (Signed)
   Covid-19 Vaccination Clinic  Name:  BRYNLEE PENNYWELL    MRN: [id number] DOB: [birth year]  11/16/2019  [name] was observed post Covid-19 immunization for 15 minutes without incident. She was provided with Vaccine Information Sheet and instruction to access the V-Safe system.   Ms. Conley was instructed to call 911 with any severe reactions post vaccine: Marland Kitchen Difficulty breathing  . Swelling of face and throat  . A fast heartbeat  . A bad rash all over body  . Dizziness and weakness   Immunizations Administered    Name Date Dose VIS Date Route   Moderna COVID-19 Vaccine 11/16/2019  9:53 AM 0.5 mL 05/2019 Intramuscular   Manufacturer: Moderna   Lot: [id number]   [place]: [id number]

## 2019-11-29 ENCOUNTER — Other Ambulatory Visit: Payer: Self-pay | Admitting: Family Medicine

## 2019-11-29 DIAGNOSIS — J302 Other seasonal allergic rhinitis: Secondary | ICD-10-CM

## 2019-11-29 DIAGNOSIS — I10 Essential (primary) hypertension: Secondary | ICD-10-CM

## 2019-11-29 NOTE — Telephone Encounter (Signed)
Requested Prescriptions  Pending Prescriptions Disp Refills   metoprolol succinate (TOPROL-XL) 25 MG 24 hr tablet [Pharmacy Med Name: METOPROLOL SUCC ER 25 MG TAB] 90 tablet 0    Sig: TAKE 1 TABLET BY MOUTH EVERY DAY     Cardiovascular:  Beta Blockers Passed - 11/29/2019  1:17 PM      Passed - Last BP in normal range    BP Readings from Last 1 Encounters:  10/17/19 129/84         Passed - Last Heart Rate in normal range    Pulse Readings from Last 1 Encounters:  10/17/19 61         Passed - Valid encounter within last 6 months    Recent Outpatient Visits          1 month ago Encounter for screening mammogram for malignant neoplasm of breast   [facility], Lupita Raider, FNP   5 months ago Anxiety associated with depression   [place], DO   9 months ago [facility] Merrilyn Puma, Jerrel Ivory, NP   4 years ago Well woman exam with routine gynecological exam   [facility] Vincenza Hews, Amy Lauren, NP   4 years ago Benign hypertension with chronic kidney disease, stage IV [facility])   [facility] Vincenza Hews, Genevie Cheshire, NP      Future Appointments            In 1 month [place], Lupita Raider, [facility]            loratadine (CLARITIN) 10 MG tablet [Pharmacy Med Name: LORATADINE 10 MG TABLET] 60 tablet 0    Sig: TAKE 1 TABLET BY MOUTH EVERY DAY     Ear, Nose, and Throat:  Antihistamines Passed - 11/29/2019  1:17 PM      Passed - Valid encounter within last 12 months    Recent Outpatient Visits          1 month ago Encounter for screening mammogram for malignant neoplasm of breast   [facility], Lupita Raider, FNP   5 months ago Anxiety associated with depression   [facility] Olin Hauser, DO   9 months ago [facility] Merrilyn Puma, Jerrel Ivory, NP   4 years ago Well woman exam with  routine gynecological exam   [facility] Vincenza Hews, Amy Lauren, NP   4 years ago Benign hypertension with chronic kidney disease, stage IV [facility])   [facility] Vincenza Hews, Genevie Cheshire, NP      Future Appointments            In 1 month [place], Lupita Raider, [facility]

## 2020-01-16 ENCOUNTER — Ambulatory Visit (INDEPENDENT_AMBULATORY_CARE_PROVIDER_SITE_OTHER): Payer: Medicare HMO

## 2020-01-16 VITALS — Ht 66.5 in | Wt 175.0 lb

## 2020-01-16 DIAGNOSIS — Z0000 Encounter for general adult medical examination without abnormal findings: Secondary | ICD-10-CM

## 2020-01-16 NOTE — Patient Instructions (Signed)
Vanessa Camacho , Thank you for taking time to come for your Medicare Wellness Visit. I appreciate your ongoing commitment to your health goals. Please review the following plan we discussed and let me know if I can assist you in the future.   Screening recommendations/referrals: Colonoscopy: completed 01/11/2015 Mammogram: not interested at this time Bone Density: n/a Recommended yearly ophthalmology/optometry visit for glaucoma screening and checkup Recommended yearly dental visit for hygiene and checkup  Vaccinations: Influenza vaccine: due Pneumococcal vaccine: n/a Tdap vaccine: completed 01/11/2015 Shingles vaccine: discussed  Covid-19: 11/16/2019, 10/19/2019  Advanced directives: Advance directive discussed with you today. Even though you declined this today please call our office should you change your mind and we can give you the proper paperwork for you to fill out.   Conditions/risks identified: smoking  Next appointment: Follow up in one year for your annual wellness visit.   Preventive Care 40-64 Years, Female Preventive care refers to lifestyle choices and visits with your health care provider that can promote health and wellness. What does preventive care include?  A yearly physical exam. This is also called an annual well check.  Dental exams once or twice a year.  Routine eye exams. Ask your health care provider how often you should have your eyes checked.  Personal lifestyle choices, including:  Daily care of your teeth and gums.  Regular physical activity.  Eating a healthy diet.  Avoiding tobacco and drug use.  Limiting alcohol use.  Practicing safe sex.  Taking low-dose aspirin daily starting at age 45.  Taking vitamin and mineral supplements as recommended by your health care provider. What happens during an annual well check? The services and screenings done by your health care provider during your annual well check will depend on your age, overall health,  lifestyle risk factors, and family history of disease. Counseling  Your health care provider may ask you questions about your:  Alcohol use.  Tobacco use.  Drug use.  Emotional well-being.  Home and relationship well-being.  Sexual activity.  Eating habits.  Work and work Statistician.  Method of birth control.  Menstrual cycle.  Pregnancy history. Screening  You may have the following tests or measurements:  Height, weight, and BMI.  Blood pressure.  Lipid and cholesterol levels. These may be checked every 5 years, or more frequently if you are over 33 years old.  Skin check.  Lung cancer screening. You may have this screening every year starting at age 58 if you have a 30-pack-year history of smoking and currently smoke or have quit within the past 15 years.  Fecal occult blood test (FOBT) of the stool. You may have this test every year starting at age 31.  Flexible sigmoidoscopy or colonoscopy. You may have a sigmoidoscopy every 5 years or a colonoscopy every 10 years starting at age 74.  Hepatitis C blood test.  Hepatitis B blood test.  Sexually transmitted disease (STD) testing.  Diabetes screening. This is done by checking your blood sugar (glucose) after you have not eaten for a while (fasting). You may have this done every 1-3 years.  Mammogram. This may be done every 1-2 years. Talk to your health care provider about when you should start having regular mammograms. This may depend on whether you have a family history of breast cancer.  BRCA-related cancer screening. This may be done if you have a family history of breast, ovarian, tubal, or peritoneal cancers.  Pelvic exam and Pap test. This may be done every 3  years starting at age 6. Starting at age 66, this may be done every 5 years if you have a Pap test in combination with an HPV test.  Bone density scan. This is done to screen for osteoporosis. You may have this scan if you are at high risk for  osteoporosis. Discuss your test results, treatment options, and if necessary, the need for more tests with your health care provider. Vaccines  Your health care provider may recommend certain vaccines, such as:  Influenza vaccine. This is recommended every year.  Tetanus, diphtheria, and acellular pertussis (Tdap, Td) vaccine. You may need a Td booster every 10 years.  Zoster vaccine. You may need this after age 41.  Pneumococcal 13-valent conjugate (PCV13) vaccine. You may need this if you have certain conditions and were not previously vaccinated.  Pneumococcal polysaccharide (PPSV23) vaccine. You may need one or two doses if you smoke cigarettes or if you have certain conditions. Talk to your health care provider about which screenings and vaccines you need and how often you need them. This information is not intended to replace advice given to you by your health care provider. Make sure you discuss any questions you have with your health care provider. Document Released: 06/21/2015 Document Revised: 02/12/2016 Document Reviewed: 03/26/2015 Elsevier Interactive Patient Education  2017 [place] Prevention in the Home Falls can cause injuries. They can happen to people of all ages. There are many things you can do to make your home safe and to help prevent falls. What can I do on the outside of my home?  Regularly fix the edges of walkways and driveways and fix any cracks.  Remove anything that might make you trip as you walk through a door, such as a raised step or threshold.  Trim any bushes or trees on the path to your home.  Use bright outdoor lighting.  Clear any walking paths of anything that might make someone trip, such as rocks or tools.  Regularly check to see if handrails are loose or broken. Make sure that both sides of any steps have handrails.  Any raised decks and porches should have guardrails on the edges.  Have any leaves, snow, or ice cleared  regularly.  Use sand or salt on walking paths during winter.  Clean up any spills in your garage right away. This includes oil or grease spills. What can I do in the bathroom?  Use night lights.  Install grab bars by the toilet and in the tub and shower. Do not use towel bars as grab bars.  Use non-skid mats or decals in the tub or shower.  If you need to sit down in the shower, use a plastic, non-slip stool.  Keep the floor dry. Clean up any water that spills on the floor as soon as it happens.  Remove soap buildup in the tub or shower regularly.  Attach bath mats securely with double-sided non-slip rug tape.  Do not have throw rugs and other things on the floor that can make you trip. What can I do in the bedroom?  Use night lights.  Make sure that you have a light by your bed that is easy to reach.  Do not use any sheets or blankets that are too big for your bed. They should not hang down onto the floor.  Have a firm chair that has side arms. You can use this for support while you get dressed.  Do not have throw rugs  and other things on the floor that can make you trip. What can I do in the kitchen?  Clean up any spills right away.  Avoid walking on wet floors.  Keep items that you use a lot in easy-to-reach places.  If you need to reach something above you, use a strong step stool that has a grab bar.  Keep electrical cords out of the way.  Do not use floor polish or wax that makes floors slippery. If you must use wax, use non-skid floor wax.  Do not have throw rugs and other things on the floor that can make you trip. What can I do with my stairs?  Do not leave any items on the stairs.  Make sure that there are handrails on both sides of the stairs and use them. Fix handrails that are broken or loose. Make sure that handrails are as long as the stairways.  Check any carpeting to make sure that it is firmly attached to the stairs. Fix any carpet that is loose  or worn.  Avoid having throw rugs at the top or bottom of the stairs. If you do have throw rugs, attach them to the floor with carpet tape.  Make sure that you have a light switch at the top of the stairs and the bottom of the stairs. If you do not have them, ask someone to add them for you. What else can I do to help prevent falls?  Wear shoes that:  Do not have high heels.  Have rubber bottoms.  Are comfortable and fit you well.  Are closed at the toe. Do not wear sandals.  If you use a stepladder:  Make sure that it is fully opened. Do not climb a closed stepladder.  Make sure that both sides of the stepladder are locked into place.  Ask someone to hold it for you, if possible.  Clearly mark and make sure that you can see:  Any grab bars or handrails.  First and last steps.  Where the edge of each step is.  Use tools that help you move around (mobility aids) if they are needed. These include:  Canes.  Walkers.  Scooters.  Crutches.  Turn on the lights when you go into a dark area. Replace any light bulbs as soon as they burn out.  Set up your furniture so you have a clear path. Avoid moving your furniture around.  If any of your floors are uneven, fix them.  If there are any pets around you, be aware of where they are.  Review your medicines with your doctor. Some medicines can make you feel dizzy. This can increase your chance of falling. Ask your doctor what other things that you can do to help prevent falls. This information is not intended to replace advice given to you by your health care provider. Make sure you discuss any questions you have with your health care provider. Document Released: 03/21/2009 Document Revised: 10/31/2015 Document Reviewed: 06/29/2014 Elsevier Interactive Patient Education  2017 [employer].

## 2020-01-16 NOTE — Progress Notes (Addendum)
I connected with Reino Bellis today by telephone and verified that I am speaking with the correct person using two identifiers. Location patient: home Location provider: work Persons participating in the virtual visit: Williette Loewe, Glenna Durand LPN.   I discussed the limitations, risks, security and privacy concerns of performing an evaluation and management service by telephone and the availability of in person appointments. I also discussed with the patient that there may be a patient responsible charge related to this service. The patient expressed understanding and verbally consented to this telephonic visit.    Interactive audio and video telecommunications were attempted between this provider and patient, however failed, due to patient having technical difficulties OR patient did not have access to video capability.  We continued and completed visit with audio only.    Vital signs may be patient reported or missing.   Subjective:   Vanessa Camacho is a 64 y.o. female who presents for Medicare Annual (Subsequent) preventive examination.  Review of Systems     Cardiac Risk Factors include: dyslipidemia;hypertension;smoking/ tobacco exposure;sedentary lifestyle     Objective:    Today's Vitals   01/16/20 1137  Weight: 175 lb (79.4 kg)  Height: 5' 6.5" (1.689 m)   Body mass index is 27.82 kg/m.  Advanced Directives 01/16/2020  Does Patient Have a Medical Advance Directive? No    Current Medications (verified) Outpatient Encounter Medications as of 01/16/2020  Medication Sig  . amLODipine (NORVASC) 10 MG tablet TAKE 1 TABLET BY MOUTH EVERY DAY  . aspirin 81 MG chewable tablet Chew by mouth daily.  . ASPIRIN 81 PO Take by mouth.  Marland Kitchen atorvastatin (LIPITOR) 20 MG tablet TAKE 1 TABLET BY MOUTH EVERY DAY  . busPIRone (BUSPAR) 5 MG tablet TAKE 1 TABLET (5 MG TOTAL) BY MOUTH 2 (TWO) TIMES DAILY AS NEEDED (ANXIETY).  . calcitRIOL (ROCALTROL) 0.25 MCG capsule Take 0.25 mcg by  mouth every other day.  . citalopram (CELEXA) 20 MG tablet Take 1 tablet (20 mg total) by mouth daily.  . hydrOXYzine (ATARAX/VISTARIL) 10 MG tablet Take 10 mg by mouth 3 (three) times daily as needed.  . loratadine (CLARITIN) 10 MG tablet TAKE 1 TABLET BY MOUTH EVERY DAY  . metoprolol succinate (TOPROL-XL) 25 MG 24 hr tablet TAKE 1 TABLET BY MOUTH EVERY DAY  . ondansetron (ZOFRAN) 4 MG tablet Take 1 tablet (4 mg total) by mouth every 8 (eight) hours as needed for nausea or vomiting.  . Vitamin D, Ergocalciferol, (DRISDOL) 1.25 MG (50000 UT) CAPS capsule TAKE 1 CAPSULE BY MOUTH MONTHLY (Patient not taking: Reported on 10/17/2019)  . [DISCONTINUED] fluticasone (FLONASE) 50 MCG/ACT nasal spray Place 2 sprays into both nostrils daily. (Patient taking differently: Place 2 sprays into both nostrils as needed. )   No facility-administered encounter medications on file as of 01/16/2020.    Allergies (verified) Patient has no known allergies.   History: Past Medical History:  Diagnosis Date  . Allergy   . Anxiety   . Arthritis   . Chronic kidney disease    stage 4 renal failure past urologist was in [place]  . Depression   . Hypertension   . Stroke ([place])   . Urinary incontinence    Past Surgical History:  Procedure Laterality Date  . CHOLECYSTECTOMY     Family History  Problem Relation Age of Onset  . Stroke Mother   . Dementia Mother   . Hypertension Mother   . Cancer Father   . Brain cancer Father   .  Skin cancer Sister    Social History   Socioeconomic History  . Marital status: Married    Spouse name: Not on file  . Number of children: Not on file  . Years of education: Not on file  . Highest education level: Not on file  Occupational History  . Not on file  Tobacco Use  . Smoking status: Current Some Day Smoker    Packs/day: 0.25    Years: 40.00    Pack years: 10.00    Types: Cigarettes    Last attempt to quit: 01/01/2015    Years since quitting: 5.0  . Smokeless  tobacco: Never Used  Vaping Use  . Vaping Use: Never used  Substance and Sexual Activity  . Alcohol use: Not Currently    Alcohol/week: 0.0 standard drinks    Comment: occassional  . Drug use: Yes    Frequency: 3.0 times per week    Types: Marijuana    Comment: enjoyment  . Sexual activity: Not Currently  Other Topics Concern  . Not on file  Social History Narrative  . Not on file   Social Determinants of Health   Financial Resource Strain: Low Risk   . Difficulty of Paying Living Expenses: Not hard at all  Food Insecurity: No Food Insecurity  . Worried About Charity fundraiser in the Last Year: Never true  . Ran Out of Food in the Last Year: Never true  Transportation Needs: No Transportation Needs  . Lack of Transportation (Medical): No  . Lack of Transportation (Non-Medical): No  Physical Activity: Inactive  . Days of Exercise per Week: 0 days  . Minutes of Exercise per Session: 0 min  Stress: No Stress Concern Present  . Feeling of Stress : Not at all  Social Connections:   . Frequency of Communication with Friends and Family:   . Frequency of Social Gatherings with Friends and Family:   . Attends Religious Services:   . Active Member of Clubs or Organizations:   . Attends Archivist Meetings:   Marland Kitchen Marital Status:     Tobacco Counseling Ready to quit: No Counseling given: Not Answered   Clinical Intake:  Pre-visit preparation completed: Yes  Pain : No/denies pain     Nutritional Status: BMI 25 -29 Overweight Nutritional Risks: None Diabetes: No  How often do you need to have someone help you when you read instructions, pamphlets, or other written materials from your doctor or pharmacy?: 1 - Never What is the last grade level you completed in school?: some college  Diabetic? no  Interpreter Needed?: No  Information entered by :: NAllen LPN   Activities of Daily Living In your present state of health, do you have any difficulty  performing the following activities: 01/16/2020 02/21/2019  Hearing? Y N  Comment sinuses -  Vision? N Y  Difficulty concentrating or making decisions? N N  Walking or climbing stairs? N N  Dressing or bathing? N N  Doing errands, shopping? N N  Preparing Food and eating ? N -  Using the Toilet? N -  In the past six months, have you accidently leaked urine? Y -  Comment wears a pad -  Do you have problems with loss of bowel control? N -  Managing your Medications? N -  Managing your Finances? N -  Housekeeping or managing your Housekeeping? N -  Some recent data might be hidden    Patient Care Team: Verl Bangs, FNP as PCP -  General (Family Medicine)  Indicate any recent Medical Services you may have received from other than [facility] providers in the past year (date may be approximate).     Assessment:   This is a routine wellness examination for Vanessa Camacho.  Hearing/Vision screen  Hearing Screening   125Hz  250Hz  500Hz  1000Hz  2000Hz  3000Hz  4000Hz  6000Hz  8000Hz   Right ear:           Left ear:           Vision Screening Comments: Regular eye exams,   Dietary issues and exercise activities discussed: Current Exercise Habits: The patient does not participate in regular exercise at present  Goals    . Patient Stated     01/16/2020, wants to set up apartment      Depression Screen PHQ 2/9 Scores 01/16/2020 10/17/2019 06/06/2019 02/21/2019 02/21/2019 05/06/2015 05/06/2015  PHQ - 2 Score 0 0 2 1 1 2  0  PHQ- 9 Score 0 3 5 7 7 13  -    Fall Risk Fall Risk  01/16/2020 06/06/2019 05/06/2015 05/06/2015 01/11/2015  Falls in the past year? 0 0 No No Yes  Number falls in past yr: - 0 - - 2 or more  Injury with Fall? - 0 - - No  Risk for fall due to : Medication side effect - - - -  Follow up Falls evaluation completed;Education provided;Falls prevention discussed Falls evaluation completed - - -    Any stairs in or around the home? No  If so, are there any without handrails?n/a Home  free of loose throw rugs in walkways, pet beds, electrical cords, etc? Yes  Adequate lighting in your home to reduce risk of falls? Yes   ASSISTIVE DEVICES UTILIZED TO PREVENT FALLS:  Life alert? No  Use of a cane, walker or w/c? No  Grab bars in the bathroom? Yes  Shower chair or bench in shower? No  Elevated toilet seat or a handicapped toilet? No   TIMED UP AND GO:  Was the test performed? No .      Cognitive Function:     6CIT Screen 01/16/2020  What Year? 0 points  What month? 0 points  What time? 0 points  Count back from 20 0 points  Months in reverse 2 points  Repeat phrase 2 points  Total Score 4    Immunizations Immunization History  Administered Date(s) Administered  . Influenza,inj,Quad PF,6+ Mos 02/21/2019  . Moderna SARS-COVID-2 Vaccination 10/19/2019, 11/16/2019  . Pneumococcal Polysaccharide-23 01/05/2018    TDAP status: Up to date Flu Vaccine status: Up to date Pneumococcal vaccine status: Up to date Covid-19 vaccine status: Completed vaccines  Qualifies for Shingles Vaccine? Yes   Zostavax completed No   Shingrix Completed?: No.    Education has been provided regarding the importance of this vaccine. Patient has been advised to call insurance company to determine out of pocket expense if they have not yet received this vaccine. Advised may also receive vaccine at local pharmacy or Health Dept. Verbalized acceptance and understanding.  Screening Tests Health Maintenance  Topic Date Due  . Hepatitis C Screening  Never done  . HIV Screening  Never done  . MAMMOGRAM  12/19/2016  . PAP SMEAR-Modifier  08/27/2018  . INFLUENZA VACCINE  01/07/2020  . COLONOSCOPY  01/10/2025  . TETANUS/TDAP  01/10/2025  . COVID-19 Vaccine  Completed    Health Maintenance  Health Maintenance Due  Topic Date Due  . Hepatitis C Screening  Never done  . HIV Screening  Never done  . MAMMOGRAM  12/19/2016  . PAP SMEAR-Modifier  08/27/2018  . INFLUENZA VACCINE   01/07/2020    Colorectal cancer screening: Completed 01/11/2015. Repeat every 10 years Mammogram status: Not interested at this time Bone Density status: n/a  Lung Cancer Screening: (Low Dose CT Chest recommended if Age 68-80 years, 30 pack-year currently smoking OR have quit w/in 15years.) does not qualify.   Lung Cancer Screening Referral: no  Additional Screening:  Hepatitis C Screening: does qualify;   Vision Screening: Recommended annual ophthalmology exams for early detection of glaucoma and other disorders of the eye. Is the patient up to date with their annual eye exam?  No  Who is the provider or what is the name of the office in which the patient attends annual eye exams? Forgot name If pt is not established with a provider, would they like to be referred to a provider to establish care? No .   Dental Screening: Recommended annual dental exams for proper oral hygiene  Community Resource Referral / Chronic Care Management: CRR required this visit?  No   CCM required this visit?  No      Plan:     I have personally reviewed and noted the following in the patient's chart:   . Medical and social history . Use of alcohol, tobacco or illicit drugs  . Current medications and supplements . Functional ability and status . Nutritional status . Physical activity . Advanced directives . List of other physicians . Hospitalizations, surgeries, and ER visits in previous 12 months . Vitals . Screenings to include cognitive, depression, and falls . Referrals and appointments  In addition, I have reviewed and discussed with patient certain preventive protocols, quality metrics, and best practice recommendations. A written personalized care plan for preventive services as well as general preventive health recommendations were provided to patient.   Due to this being a telephonic visit, the after visit summary with patients personalized plan was offered to patient via mail or  my-chart Patient preferred to pick up at office at next visit.   Kellie Simmering, LPN   [id number]   Nurse Notes: Not interested in getting mammogram at this time.

## 2020-01-25 ENCOUNTER — Ambulatory Visit (INDEPENDENT_AMBULATORY_CARE_PROVIDER_SITE_OTHER): Payer: Medicare HMO | Admitting: Family Medicine

## 2020-01-25 ENCOUNTER — Other Ambulatory Visit (HOSPITAL_COMMUNITY)
Admission: RE | Admit: 2020-01-25 | Discharge: 2020-01-25 | Disposition: A | Discharge status: Home / Self Care | Payer: Medicare HMO | Source: Ambulatory Visit | Attending: Family Medicine | Admitting: Family Medicine

## 2020-01-25 ENCOUNTER — Other Ambulatory Visit: Payer: Self-pay

## 2020-01-25 ENCOUNTER — Other Ambulatory Visit: Payer: Self-pay | Admitting: Family Medicine

## 2020-01-25 ENCOUNTER — Encounter: Payer: Self-pay | Admitting: Family Medicine

## 2020-01-25 VITALS — BP 117/63 | HR 65 | Temp 98.3°F | Resp 18 | Ht 66.5 in | Wt 183.6 lb

## 2020-01-25 DIAGNOSIS — Z0000 Encounter for general adult medical examination without abnormal findings: Secondary | ICD-10-CM | POA: Diagnosis not present

## 2020-01-25 DIAGNOSIS — E785 Hyperlipidemia, unspecified: Secondary | ICD-10-CM

## 2020-01-25 DIAGNOSIS — Z23 Encounter for immunization: Secondary | ICD-10-CM | POA: Insufficient documentation

## 2020-01-25 DIAGNOSIS — Z124 Encounter for screening for malignant neoplasm of cervix: Secondary | ICD-10-CM | POA: Diagnosis not present

## 2020-01-25 DIAGNOSIS — F418 Other specified anxiety disorders: Secondary | ICD-10-CM | POA: Diagnosis not present

## 2020-01-25 DIAGNOSIS — N6452 Nipple discharge: Secondary | ICD-10-CM | POA: Diagnosis not present

## 2020-01-25 DIAGNOSIS — J302 Other seasonal allergic rhinitis: Secondary | ICD-10-CM

## 2020-01-25 DIAGNOSIS — N184 Chronic kidney disease, stage 4 (severe): Secondary | ICD-10-CM

## 2020-01-25 DIAGNOSIS — R635 Abnormal weight gain: Secondary | ICD-10-CM

## 2020-01-25 DIAGNOSIS — I10 Essential (primary) hypertension: Secondary | ICD-10-CM | POA: Diagnosis not present

## 2020-01-25 DIAGNOSIS — Z1151 Encounter for screening for human papillomavirus (HPV): Secondary | ICD-10-CM | POA: Diagnosis not present

## 2020-01-25 DIAGNOSIS — Z13820 Encounter for screening for osteoporosis: Secondary | ICD-10-CM | POA: Insufficient documentation

## 2020-01-25 DIAGNOSIS — Z1211 Encounter for screening for malignant neoplasm of colon: Secondary | ICD-10-CM | POA: Insufficient documentation

## 2020-01-25 DIAGNOSIS — E559 Vitamin D deficiency, unspecified: Secondary | ICD-10-CM | POA: Insufficient documentation

## 2020-01-25 NOTE — Assessment & Plan Note (Signed)
Last PAP testing 08/27/2015.  Result negative and negative HPV.  Due for PAP testing.  Plan: 1. PAP testing completed and sent to lab for evaluation

## 2020-01-25 NOTE — Progress Notes (Signed)
Subjective:    Patient ID: Vanessa Camacho, female    DOB: [birth year], 64 y.o.   MRN: [id number]  Vanessa Camacho is a 64 y.o. female presenting on 01/25/2020 for Annual Exam   HPI   HEALTH MAINTENANCE:  Weight/BMI: Overweight, BMI 29.19% Physical activity: Stays active with walking Diet: Regular Seatbelt: Always Sunscreen: As needed Mammogram: Due, Ordered PAP: Due, Completed today DEXA: Due, Ordered Colon cancer screening: Cologuard ordered HIV & Hep C Screening: Offered and declined GC/CT: Offered and declined Optometry: As needed Dentistry: As needed  IMMUNIZATIONS: Influenza: Due this season Tetanus: Due, given COVID: Completed, Moderna, 10/19/2019 + 11/16/2019 Pneumonia: Up to date Pneumococcal 23 - 01/05/2018  STOP BANG SCREENING Snoring: Do you snore loudly? yes/no: Yes Tired: Do you often feel tired, fatigued, sleeping during the daytime? yes/no: No Observed: Has anyone observed you stop breathing or choking/gasping during your sleep? yes/no: No Pressure: Do you have or being treated for high blood pressure? yes/no: Yes BMI: Greater than 35? yes/no: No Age: Older than 50? yes/no: Yes Neck Side: Greater than 17 (males)/16 (females)? yes/no: No Gender: Born as female gender? yes/no: No  Depression screen [facility] 2/9 01/25/2020 01/16/2020 10/17/2019  Decreased Interest 0 0 0  Down, Depressed, Hopeless 0 0 0  PHQ - 2 Score 0 0 0  Altered sleeping 1 0 3  Tired, decreased energy 0 0 0  Change in appetite 1 0 0  Feeling bad or failure about yourself  0 0 0  Trouble concentrating 0 0 0  Moving slowly or fidgety/restless 0 0 0  Suicidal thoughts 0 0 0  PHQ-9 Score 2 0 3  Difficult doing work/chores Not difficult at all Not difficult at all Not difficult at all  Some recent data might be hidden    Past Medical History:  Diagnosis Date  . Allergy   . Anxiety   . Arthritis   . Chronic kidney disease    stage 4 renal failure past urologist was in [place]  .  Depression   . Hypertension   . Stroke ([place])   . Urinary incontinence    Past Surgical History:  Procedure Laterality Date  . CHOLECYSTECTOMY     Social History   Socioeconomic History  . Marital status: Married    Spouse name: Not on file  . Number of children: Not on file  . Years of education: Not on file  . Highest education level: Not on file  Occupational History  . Not on file  Tobacco Use  . Smoking status: Current Some Day Smoker    Packs/day: 0.25    Years: 40.00    Pack years: 10.00    Types: Cigarettes    Last attempt to quit: 01/01/2015    Years since quitting: 5.0  . Smokeless tobacco: Never Used  Vaping Use  . Vaping Use: Never used  Substance and Sexual Activity  . Alcohol use: Not Currently    Alcohol/week: 0.0 standard drinks    Comment: occassional  . Drug use: Yes    Frequency: 3.0 times per week    Types: Marijuana    Comment: enjoyment  . Sexual activity: Not Currently  Other Topics Concern  . Not on file  Social History Narrative  . Not on file   Social Determinants of Health   Financial Resource Strain: Low Risk   . Difficulty of Paying Living Expenses: Not hard at all  Food Insecurity: No Food Insecurity  . Worried About [employer] of  Food in the Last Year: Never true  . Ran Out of Food in the Last Year: Never true  Transportation Needs: No Transportation Needs  . Lack of Transportation (Medical): No  . Lack of Transportation (Non-Medical): No  Physical Activity: Inactive  . Days of Exercise per Week: 0 days  . Minutes of Exercise per Session: 0 min  Stress: No Stress Concern Present  . Feeling of Stress : Not at all  Social Connections:   . Frequency of Communication with Friends and Family: Not on file  . Frequency of Social Gatherings with Friends and Family: Not on file  . Attends Religious Services: Not on file  . Active Member of Clubs or Organizations: Not on file  . Attends Archivist Meetings: Not on file  .  Marital Status: Not on file  Intimate Partner Violence:   . Fear of Current or Ex-Partner: Not on file  . Emotionally Abused: Not on file  . Physically Abused: Not on file  . Sexually Abused: Not on file   Family History  Problem Relation Age of Onset  . Stroke Mother   . Dementia Mother   . Hypertension Mother   . Cancer Father   . Brain cancer Father   . Skin cancer Sister    Current Outpatient Medications on File Prior to Visit  Medication Sig  . amLODipine (NORVASC) 10 MG tablet TAKE 1 TABLET BY MOUTH EVERY DAY  . aspirin 81 MG chewable tablet Chew by mouth daily.  . ASPIRIN 81 PO Take by mouth.  Marland Kitchen atorvastatin (LIPITOR) 20 MG tablet TAKE 1 TABLET BY MOUTH EVERY DAY  . busPIRone (BUSPAR) 5 MG tablet TAKE 1 TABLET (5 MG TOTAL) BY MOUTH 2 (TWO) TIMES DAILY AS NEEDED (ANXIETY).  . calcitRIOL (ROCALTROL) 0.25 MCG capsule Take 0.25 mcg by mouth every other day.  . citalopram (CELEXA) 20 MG tablet Take 1 tablet (20 mg total) by mouth daily.  . hydrOXYzine (ATARAX/VISTARIL) 10 MG tablet Take 10 mg by mouth 3 (three) times daily as needed.  . metoprolol succinate (TOPROL-XL) 25 MG 24 hr tablet TAKE 1 TABLET BY MOUTH EVERY DAY  . ondansetron (ZOFRAN) 4 MG tablet Take 1 tablet (4 mg total) by mouth every 8 (eight) hours as needed for nausea or vomiting.  . [DISCONTINUED] fluticasone (FLONASE) 50 MCG/ACT nasal spray Place 2 sprays into both nostrils daily. (Patient taking differently: Place 2 sprays into both nostrils as needed. )   No current facility-administered medications on file prior to visit.    Per HPI unless specifically indicated above     Objective:    BP 117/63 (BP Location: Right Arm, Patient Position: Sitting, Cuff Size: Normal)   Pulse 65   Temp 98.3 F (36.8 C) (Oral)   Resp 18   Ht 5' 6.5" (1.689 m)   Wt 183 lb 9.6 oz (83.3 kg)   SpO2 98%   BMI 29.19 kg/m   Wt Readings from Last 3 Encounters:  01/25/20 183 lb 9.6 oz (83.3 kg)  01/16/20 175 lb (79.4 kg)   10/17/19 181 lb (82.1 kg)    Physical Exam Vitals reviewed.  Constitutional:      General: She is not in acute distress.    Appearance: Normal appearance. She is well-developed, well-groomed and overweight. She is not ill-appearing or toxic-appearing.  HENT:     Head: Normocephalic and atraumatic.     Right Ear: Tympanic membrane, ear canal and external ear normal. There is no impacted cerumen.  Left Ear: Tympanic membrane, ear canal and external ear normal. There is no impacted cerumen.     Nose: Nose normal. No congestion or rhinorrhea.     Comments: Lizbeth Bark is in place, covering mouth and nose.    Mouth/Throat:     Lips: Pink.     Mouth: Mucous membranes are moist.     Dentition: Has dentures.     Pharynx: Oropharynx is clear. Uvula midline. No oropharyngeal exudate or posterior oropharyngeal erythema.  Eyes:     General: Lids are normal. Vision grossly intact. No scleral icterus.       Right eye: No discharge.        Left eye: No discharge.     Extraocular Movements: Extraocular movements intact.     Conjunctiva/sclera: Conjunctivae normal.     Pupils: Pupils are equal, round, and reactive to light.  Neck:     Thyroid: No thyroid mass or thyromegaly.  Cardiovascular:     Rate and Rhythm: Normal rate and regular rhythm.     Pulses: Normal pulses.          Dorsalis pedis pulses are 2+ on the right side and 2+ on the left side.     Heart sounds: Normal heart sounds. No murmur heard.  No friction rub. No gallop.   Pulmonary:     Effort: Pulmonary effort is normal. No respiratory distress.     Breath sounds: Normal breath sounds.  Chest:     Breasts:        Right: Nipple discharge present. No swelling, bleeding, inverted nipple, mass, skin change or tenderness.        Left: Normal.  Abdominal:     General: Abdomen is flat. Bowel sounds are normal. There is no distension.     Palpations: Abdomen is soft. There is no hepatomegaly, splenomegaly or mass.     Tenderness:  There is no abdominal tenderness. There is no guarding or rebound.     Hernia: No hernia is present. There is no hernia in the left inguinal area or right inguinal area.  Genitourinary:    General: Normal vulva.     Exam position: Lithotomy position.     Pubic Area: No rash or pubic lice.      Labia:        Right: No rash, tenderness, lesion or injury.        Left: No rash, tenderness, lesion or injury.      Urethra: No urethral pain, urethral swelling or urethral lesion.     Vagina: Normal. No signs of injury and foreign body. No vaginal discharge, erythema, tenderness, bleeding or lesions.     Cervix: No cervical motion tenderness, discharge, friability, lesion, erythema, cervical bleeding or eversion.     Uterus: Normal. Not enlarged and not tender.      Adnexa: Right adnexa normal and left adnexa normal.  Musculoskeletal:        General: Normal range of motion.     Cervical back: Normal range of motion and neck supple. No tenderness.     Right lower leg: No edema.     Left lower leg: No edema.     Comments: Normal tone, strength 5/5 BUE & BLE  Feet:     Right foot:     Skin integrity: Skin integrity normal.     Left foot:     Skin integrity: Skin integrity normal.  Lymphadenopathy:     Cervical: No cervical adenopathy.     Right cervical:  No superficial cervical adenopathy.    Left cervical: No superficial cervical adenopathy.     Upper Body:     Right upper body: No supraclavicular or axillary adenopathy.     Left upper body: No supraclavicular or axillary adenopathy.     Lower Body: No right inguinal adenopathy. No left inguinal adenopathy.  Skin:    General: Skin is warm and dry.     Capillary Refill: Capillary refill takes less than 2 seconds.  Neurological:     General: No focal deficit present.     Mental Status: She is alert and oriented to person, place, and time.     Cranial Nerves: No cranial nerve deficit.     Sensory: No sensory deficit.     Motor: No  weakness.     Coordination: Coordination normal.     Gait: Gait normal.     Deep Tendon Reflexes: Reflexes normal.  Psychiatric:        Attention and Perception: Attention and perception normal.        Mood and Affect: Mood and affect normal.        Speech: Speech normal.        Behavior: Behavior normal. Behavior is cooperative.        Thought Content: Thought content normal.        Cognition and Memory: Cognition and memory normal.        Judgment: Judgment normal.     Results for orders placed or performed during the hospital encounter of 08/03/19  CBC with Differential  Result Value Ref Range   WBC 14.8 (H) 4.0 - 10.5 K/uL   RBC 5.09 3.87 - 5.11 MIL/uL   Hemoglobin 15.3 (H) 12.0 - 15.0 g/dL   HCT 46.4 (H) 36 - 46 %   MCV 91.2 80.0 - 100.0 fL   MCH 30.1 26.0 - 34.0 pg   MCHC 33.0 30.0 - 36.0 g/dL   RDW 12.5 11.5 - 15.5 %   Platelets 181 150 - 400 K/uL   nRBC 0.0 0.0 - 0.2 %   Neutrophils Relative % 84 %   Neutro Abs 12.5 (H) 1.7 - 7.7 K/uL   Lymphocytes Relative 10 %   Lymphs Abs 1.5 0.7 - 4.0 K/uL   Monocytes Relative 5 %   Monocytes Absolute 0.7 0 - 1 K/uL   Eosinophils Relative 0 %   Eosinophils Absolute 0.0 0 - 0 K/uL   Basophils Relative 0 %   Basophils Absolute 0.0 0 - 0 K/uL   Immature Granulocytes 1 %   Abs Immature Granulocytes 0.07 0.00 - 0.07 K/uL  Comprehensive metabolic panel  Result Value Ref Range   Sodium 136 135 - 145 mmol/L   Potassium 3.9 3.5 - 5.1 mmol/L   Chloride 99 98 - 111 mmol/L   CO2 24 22 - 32 mmol/L   Glucose, Bld 128 (H) 70 - 99 mg/dL   BUN 28 (H) 8 - 23 mg/dL   Creatinine, Ser 2.14 (H) 0.44 - 1.00 mg/dL   Calcium 9.9 8.9 - 10.3 mg/dL   Total Protein 8.4 (H) 6.5 - 8.1 g/dL   Albumin 4.9 3.5 - 5.0 g/dL   AST 22 15 - 41 U/L   ALT 17 0 - 44 U/L   Alkaline Phosphatase 80 38 - 126 U/L   Total Bilirubin 1.1 0.3 - 1.2 mg/dL   GFR calc non Af Amer 24 (L) >60 mL/min   GFR calc Af Amer 28 (L) >60 mL/min  Anion gap 13 5 - 15       Assessment & Plan:   Problem List Items Addressed This Visit      Cardiovascular and Mediastinum   Essential hypertension    Controlled hypertension.  BP is at goal < 130/80.  Pt is working on lifestyle modifications.  Taking medications tolerating well without side effects.  Complications: Overweight, hyperlipidemia, CKD stage 4, tobacco use  Plan: 1. Continue taking amlodipine 10mg  daily and metoprolol succinate (toprol XL) 25mg  daily 2. Obtain labs in the next 1-2 weeks  3. Encouraged heart healthy diet and increasing exercise to 30 minutes most days of the week, going no more than 2 days in a row without exercise. 4. Check BP 1-2 x per week at home, keep log, and bring to clinic at next appointment. 5. Follow up 3 months.         Relevant Orders   CBC with Differential   COMPLETE METABOLIC PANEL WITH GFR     Genitourinary   CKD (chronic kidney disease) stage 4, GFR 15-29 ml/min (HCC)    Follows with Dr. Juleen China with Nephrology        Other   Anxiety associated with depression    PHQ9-2.  Reports stable and well controlled with citalopram.  Has taken the hydroxyzine 10mg  for some symptoms of anxiety with good relief of symptoms.  Has taken the buspar a few times, is unsure if she is having relief from this.  Denies any SI/HI.  Plan: 1. Continue citalopram 20mg  daily and hydroxyzine 10mg  TID PRN 2. Can try the buspar 5mg  BID PRN and will re-evaluate at next follow up visit 3. RTC in 3 months      Relevant Orders   Thyroid Panel With TSH   Hyperlipidemia    Status unknown.  Recheck labs.  Continue meds without changes today.  Refills provided. Followup after labs.       Relevant Orders   Lipid Profile   Discharge from right nipple    Right nipple with discharge on palpation.  Likely topical pimple on exam, will have diagnostic mammogram completed.  Plan: 1. Diagnostic mammogram ordered      Relevant Orders   MM DIAG BREAST TOMO BILATERAL   Screening for  colon cancer    Pt requiring colon cancer screening.  Denies family history of colon cancer.  Plan: - Discussed timing for initiation of colon cancer screening ACS vs USPSTF guidelines - Mutual decision making discussion for options of colonoscopy vs cologuard.  Pt prefers cologuard. - Ordered Cologuard today - Patient advised to contact insurance first to learn cost.       Relevant Orders   Cologuard   Immunization due    Pt needs tetanus vaccine.  > 10 years since last vaccination.  Plan: 1. Reviewed tetanus disease and need for vaccination. 2. Administer vaccine today.       Relevant Orders   Tdap vaccine greater than or equal to 7yo IM (Completed)   Screening for osteoporosis    Pt postmenopausal w/out history of prior DEXA scan.    Plan: 1. Obtain DG bone density.         Relevant Orders   DG Bone Density   Screening for cervical cancer    Last PAP testing 08/27/2015.  Result negative and negative HPV.  Due for PAP testing.  Plan: 1. PAP testing completed and sent to lab for evaluation       Relevant Orders   Cytology -  PAP   Vitamin D deficiency    Status unknown.  Recheck labs. Followup after labs.       Relevant Orders   VITAMIN D 25 Hydroxy (Vit-D Deficiency, Fractures)   Annual physical exam - Primary    Annual physical exam.  Well adult with no acute concerns.  Plan: 1. Obtain health maintenance screenings as above according to age. - Increase physical activity to 30 minutes most days of the week.  - Eat healthy diet high in vegetables and fruits; low in refined carbohydrates. - Screening labs and tests as ordered 2. Return 1 year for annual physical.        Other Visit Diagnoses    Weight gain       Relevant Orders   Thyroid Panel With TSH      No orders of the defined types were placed in this encounter.     Follow up plan: Return in about 3 months (around 04/26/2020) for HTN, Depression F/U.  Harlin Rain, FNP-C Family  Nurse Practitioner [facility] 01/25/2020, 11:08 AM

## 2020-01-25 NOTE — Assessment & Plan Note (Signed)
Status unknown.  Recheck labs.  Followup after labs.  

## 2020-01-25 NOTE — Assessment & Plan Note (Signed)
Pt postmenopausal w/out history of prior DEXA scan.    Plan: 1. Obtain DG bone density.

## 2020-01-25 NOTE — Patient Instructions (Addendum)
We have sent your PAP to the lab for testing.  Will contact you once the results are received.  Have your labs drawn today and we will contact you with the results.  For Mammogram screening for breast cancer & DEXA Scan (Bone mineral density) screening for osteoporosis  Call the [place] below anytime to schedule your own appointment now that order has been placed.  [facility] [place], [place] [address] Phone: [phone]  [facility] [address] [place], [place] [address] Phone: [phone]  Your cologuard kit will be mailed to your home for your colon cancer screening.  If you have any questions with completing this, there will be a customer service number that arrives with this to help you walk through the process.  We have given you your TDAP vaccine today.  Keep your regularly scheduled follow up visit with your nephrologist and definitely discuss the COVID booster vaccine with your history of kidney disease.  Well Visit: Care Instructions Overview  Well visits can help you stay healthy. Your provider has checked your overall health and may have suggested ways to take good care of yourself. Your provider also may have recommended tests. At home, you can help prevent illness with healthy eating, regular exercise, and other steps.  Follow-up care is a key part of your treatment and safety. Be sure to make and go to all appointments, and call your provider if you are having problems. It's also a good idea to know your test results and keep a list of the medicines you take.  How can you care for yourself at home?   Get screening tests that you and your doctor decide on. Screening helps find diseases before any symptoms appear.   Eat healthy foods. Choose fruits, vegetables, whole grains, protein, and low-fat dairy foods. Limit fat, especially saturated fat. Reduce salt in your  diet.   Limit alcohol. If you are a man, have no more than 2 drinks a day or 14 drinks a week. If you are a woman, have no more than 1 drink a day or 7 drinks a week.   Get at least 30 minutes of physical activity on most days of the week.  We recommend you go no more than 2 days in a row without exercise. Walking is a good choice. You also may want to do other activities, such as running, swimming, cycling, or playing tennis or team sports. Discuss any changes in your exercise program with your provider.   Reach and stay at a healthy weight. This will lower your risk for many problems, such as obesity, diabetes, heart disease, and high blood pressure.   Do not smoke or allow others to smoke around you. If you need help quitting, talk to your provider about stop-smoking programs and medicines. These can increase your chances of quitting for good.  Can call 1-800-QUIT-NOW 628-152-0130) for the [facility], assistance with smoking cessation.   Care for your mental health. It is easy to get weighed down by worry and stress. Learn strategies to manage stress, like deep breathing and mindfulness, and stay connected with your family and community. If you find you often feel sad or hopeless, talk with your provider. Treatment can help.   Talk to your provider about whether you have any risk factors for sexually transmitted infections (STIs). You can help prevent STIs if you wait to have sex with a new partner (or partners)  until you've each been tested for STIs. It also helps if you use condoms (female or female condoms) and if you limit your sex partners to one person who only has sex with you. Vaccines are available for some STIs, such as HPV (these are age dependent).   Use birth control if it's important to you to prevent pregnancy. Talk with your provider about the choices available and what might be best for you.   If you think you may have a problem with alcohol or drug use, talk  to your provider. This includes prescription medicines (such as amphetamines and opioids) and illegal drugs (such as cocaine and methamphetamine). Your provider can help you figure out what type of treatment is best for you.   If you have concerns about domestic violence or intimate partner violence, there are resources available to you. National Domestic Abuse Hotline (971)702-1591   Protect your skin from too much sun. When you're outdoors from 10 a.m. to 4 p.m., stay in the shade or cover up with clothing and a hat with a wide brim. Wear sunglasses that block UV rays. Even when it's cloudy, put broad-spectrum sunscreen (SPF 30 or higher) on any exposed skin.   See a dentist one or two times a year for checkups and to have your teeth cleaned.   See an eye doctor once per year for an eye exam.   Wear a seat belt in the car.  When should you call for help?  Watch closely for changes in your health, and be sure to contact your provider if you have any problems or symptoms that concern you.  We will plan to see you back in 3 months for hypertension and depression follow up visit  You will receive a survey after today's visit either digitally by e-mail or paper by [place] mail. Your experiences and feedback matter to [place].  Please respond so we know how we are doing as we provide care for you.  Call us with any questions/concerns/needs.  It is my goal to be available to you for your health concerns.  Thanks for choosing me to be a partner in your healthcare needs!  Harlin Rain, FNP-C Family Nurse Practitioner [facility] Phone: [phone]

## 2020-01-25 NOTE — Assessment & Plan Note (Signed)
Controlled hypertension.  BP is at goal < 130/80.  Pt is working on lifestyle modifications.  Taking medications tolerating well without side effects.  Complications: Overweight, hyperlipidemia, CKD stage 4, tobacco use  Plan: 1. Continue taking amlodipine 10mg  daily and metoprolol succinate (toprol XL) 25mg  daily 2. Obtain labs in the next 1-2 weeks  3. Encouraged heart healthy diet and increasing exercise to 30 minutes most days of the week, going no more than 2 days in a row without exercise. 4. Check BP 1-2 x per week at home, keep log, and bring to clinic at next appointment. 5. Follow up 3 months.

## 2020-01-25 NOTE — Assessment & Plan Note (Signed)
Status unknown.  Recheck labs.  Continue meds without changes today.  Refills provided. Followup after labs.  

## 2020-01-25 NOTE — Assessment & Plan Note (Signed)
Pt needs tetanus vaccine.  > 10 years since last vaccination.  Plan: 1. Reviewed tetanus disease and need for vaccination. 2. Administer vaccine today.

## 2020-01-25 NOTE — Assessment & Plan Note (Signed)
Right nipple with discharge on palpation.  Likely topical pimple on exam, will have diagnostic mammogram completed.  Plan: 1. Diagnostic mammogram ordered

## 2020-01-25 NOTE — Assessment & Plan Note (Signed)
Pt requiring colon cancer screening.  Denies family history of colon cancer.  Plan: - Discussed timing for initiation of colon cancer screening ACS vs USPSTF guidelines - Mutual decision making discussion for options of colonoscopy vs cologuard.  Pt prefers cologuard. - Ordered Cologuard today - Patient advised to contact insurance first to learn cost.

## 2020-01-25 NOTE — Assessment & Plan Note (Signed)
Follows with Dr. Juleen China with Nephrology

## 2020-01-25 NOTE — Assessment & Plan Note (Signed)
Annual physical exam.  Well adult with no acute concerns.  Plan: 1. Obtain health maintenance screenings as above according to age. - Increase physical activity to 30 minutes most days of the week.  - Eat healthy diet high in vegetables and fruits; low in refined carbohydrates. - Screening labs and tests as ordered 2. Return 1 year for annual physical.

## 2020-01-25 NOTE — Assessment & Plan Note (Signed)
FMB8-4.  Reports stable and well controlled with citalopram.  Has taken the hydroxyzine 10mg  for some symptoms of anxiety with good relief of symptoms.  Has taken the buspar a few times, is unsure if she is having relief from this.  Denies any SI/HI.  Plan: 1. Continue citalopram 20mg  daily and hydroxyzine 10mg  TID PRN 2. Can try the buspar 5mg  BID PRN and will re-evaluate at next follow up visit 3. RTC in 3 months

## 2020-01-27 LAB — CBC WITH DIFFERENTIAL/PLATELET
Absolute Monocytes: 546 cells/uL (ref 200–950)
Basophils Absolute: 31 cells/uL (ref 0–200)
Basophils Relative: 0.4 %
Eosinophils Absolute: 179 cells/uL (ref 15–500)
Eosinophils Relative: 2.3 %
HCT: 43.5 % (ref 35.0–45.0)
Hemoglobin: 13.9 g/dL (ref 11.7–15.5)
Lymphs Abs: 1997 cells/uL (ref 850–3900)
MCH: 30 pg (ref 27.0–33.0)
MCHC: 32 g/dL (ref 32.0–36.0)
MCV: 94 fL (ref 80.0–100.0)
MPV: 11.3 fL (ref 7.5–12.5)
Monocytes Relative: 7 %
Neutro Abs: 5047 cells/uL (ref 1500–7800)
Neutrophils Relative %: 64.7 %
Platelets: 134 10*3/uL — ABNORMAL LOW (ref 140–400)
RBC: 4.63 10*6/uL (ref 3.80–5.10)
RDW: 12 % (ref 11.0–15.0)
Total Lymphocyte: 25.6 %
WBC: 7.8 10*3/uL (ref 3.8–10.8)

## 2020-01-27 LAB — COMPLETE METABOLIC PANEL WITH GFR
AG Ratio: 1.8 (calc) (ref 1.0–2.5)
ALT: 9 U/L (ref 6–29)
AST: 11 U/L (ref 10–35)
Albumin: 4.1 g/dL (ref 3.6–5.1)
Alkaline phosphatase (APISO): 75 U/L (ref 37–153)
BUN/Creatinine Ratio: 12 (calc) (ref 6–22)
BUN: 31 mg/dL — ABNORMAL HIGH (ref 7–25)
CO2: 24 mmol/L (ref 20–32)
Calcium: 9.2 mg/dL (ref 8.6–10.4)
Chloride: 107 mmol/L (ref 98–110)
Creat: 2.55 mg/dL — ABNORMAL HIGH (ref 0.50–0.99)
GFR, Est African American: 22 mL/min/{1.73_m2} — ABNORMAL LOW (ref >=60)
GFR, Est Non African American: 19 mL/min/{1.73_m2} — ABNORMAL LOW (ref >=60)
Globulin: 2.3 g/dL (calc) (ref 1.9–3.7)
Glucose, Bld: 110 mg/dL — ABNORMAL HIGH (ref 65–99)
Potassium: 4.3 mmol/L (ref 3.5–5.3)
Sodium: 139 mmol/L (ref 135–146)
Total Bilirubin: 0.5 mg/dL (ref 0.2–1.2)
Total Protein: 6.4 g/dL (ref 6.1–8.1)

## 2020-01-27 LAB — THYROID PANEL WITH TSH
Free Thyroxine Index: 2.9 (ref 1.4–3.8)
T3 Uptake: 31 % (ref 22–35)
T4, Total: 9.4 ug/dL (ref 5.1–11.9)
TSH: 1.4 mIU/L (ref 0.40–4.50)

## 2020-01-27 LAB — LIPID PANEL
Cholesterol: 111 mg/dL (ref <200)
HDL: 51 mg/dL (ref >=50)
LDL Cholesterol (Calc): 47 mg/dL (calc)
Non-HDL Cholesterol (Calc): 60 mg/dL (calc) (ref <130)
Total CHOL/HDL Ratio: 2.2 (calc) (ref <5.0)
Triglycerides: 51 mg/dL (ref <150)

## 2020-01-27 LAB — VITAMIN D 25 HYDROXY (VIT D DEFICIENCY, FRACTURES): Vit D, 25-Hydroxy: 25 ng/mL — ABNORMAL LOW (ref 30–100)

## 2020-01-27 LAB — HEMOGLOBIN A1C W/OUT EAG: Hgb A1c MFr Bld: 5.4 % of total Hgb (ref <5.7)

## 2020-01-29 LAB — CYTOLOGY - PAP
Comment: NEGATIVE
Diagnosis: NEGATIVE
High risk HPV: NEGATIVE

## 2020-02-16 ENCOUNTER — Other Ambulatory Visit: Payer: Self-pay | Admitting: Family Medicine

## 2020-02-16 DIAGNOSIS — E785 Hyperlipidemia, unspecified: Secondary | ICD-10-CM

## 2020-02-20 ENCOUNTER — Other Ambulatory Visit: Payer: Self-pay | Admitting: Family Medicine

## 2020-02-20 DIAGNOSIS — I10 Essential (primary) hypertension: Secondary | ICD-10-CM

## 2020-02-20 DIAGNOSIS — N184 Chronic kidney disease, stage 4 (severe): Secondary | ICD-10-CM

## 2020-02-20 NOTE — Telephone Encounter (Signed)
Requested Prescriptions  Pending Prescriptions Disp Refills  . amLODipine (NORVASC) 10 MG tablet [Pharmacy Med Name: AMLODIPINE BESYLATE 10 MG TAB] 90 tablet 0    Sig: TAKE 1 TABLET BY MOUTH EVERY DAY     Cardiovascular:  Calcium Channel Blockers Passed - 02/20/2020  1:24 AM      Passed - Last BP in normal range    BP Readings from Last 1 Encounters:  01/25/20 117/63         Passed - Valid encounter within last 6 months    Recent Outpatient Visits          3 weeks ago Annual physical exam   [facility], Lupita Raider, FNP   4 months ago Encounter for screening mammogram for malignant neoplasm of breast   [place], FNP   8 months ago Anxiety associated with depression   [facility] Olin Hauser, DO   12 months ago [facility] Merrilyn Puma, Jerrel Ivory, NP   4 years ago Well woman exam with routine gynecological exam   [facility] Luciana Axe, NP      Future Appointments            In 2 months [place], Lupita Raider, [facility], [facility]

## 2020-03-22 ENCOUNTER — Other Ambulatory Visit: Payer: Self-pay | Admitting: Family Medicine

## 2020-03-22 DIAGNOSIS — J302 Other seasonal allergic rhinitis: Secondary | ICD-10-CM

## 2020-03-22 NOTE — Telephone Encounter (Signed)
Requested Prescriptions  Pending Prescriptions Disp Refills   loratadine (CLARITIN) 10 MG tablet [Pharmacy Med Name: LORATADINE 10 MG TABLET] 60 tablet 0    Sig: TAKE 1 TABLET BY MOUTH EVERY DAY     Ear, Nose, and Throat:  Antihistamines Passed - 03/22/2020  2:31 PM      Passed - Valid encounter within last 12 months    Recent Outpatient Visits          1 month ago Annual physical exam   [facility], Lupita Raider, FNP   5 months ago Encounter for screening mammogram for malignant neoplasm of breast   [facility], Lupita Raider, FNP   9 months ago Anxiety associated with depression   [place], DO   1 year ago [facility] Merrilyn Puma, Jerrel Ivory, NP   4 years ago Well woman exam with routine gynecological exam   [facility] Luciana Axe, NP      Future Appointments            In 1 month [place], Lupita Raider, [facility], [facility]

## 2020-04-21 ENCOUNTER — Other Ambulatory Visit: Payer: Self-pay | Admitting: Family Medicine

## 2020-04-21 DIAGNOSIS — I10 Essential (primary) hypertension: Secondary | ICD-10-CM

## 2020-04-21 NOTE — Telephone Encounter (Signed)
Requested Prescriptions  Pending Prescriptions Disp Refills   metoprolol succinate (TOPROL-XL) 25 MG 24 hr tablet [Pharmacy Med Name: METOPROLOL SUCC ER 25 MG TAB] 90 tablet 0    Sig: TAKE 1 TABLET BY MOUTH EVERY DAY     Cardiovascular:  Beta Blockers Passed - 04/21/2020  4:31 PM      Passed - Last BP in normal range    BP Readings from Last 1 Encounters:  01/25/20 117/63         Passed - Last Heart Rate in normal range    Pulse Readings from Last 1 Encounters:  01/25/20 65         Passed - Valid encounter within last 6 months    Recent Outpatient Visits          2 months ago Annual physical exam   [facility], Lupita Raider, FNP   6 months ago Encounter for screening mammogram for malignant neoplasm of breast   [facility], Lupita Raider, FNP   10 months ago Anxiety associated with depression   [place], DO   1 year ago [facility] Merrilyn Puma, Jerrel Ivory, NP   4 years ago Well woman exam with routine gynecological exam   [facility] Luciana Axe, NP      Future Appointments            In 2 weeks [place], Lupita Raider, [facility], [facility]

## 2020-04-29 ENCOUNTER — Ambulatory Visit: Payer: Medicare HMO | Admitting: Family Medicine

## 2020-05-06 ENCOUNTER — Other Ambulatory Visit: Payer: Self-pay

## 2020-05-06 ENCOUNTER — Ambulatory Visit (INDEPENDENT_AMBULATORY_CARE_PROVIDER_SITE_OTHER): Payer: Medicare HMO | Admitting: Family Medicine

## 2020-05-06 ENCOUNTER — Encounter: Payer: Self-pay | Admitting: Family Medicine

## 2020-05-06 VITALS — BP 120/70 | HR 71 | Temp 98.7°F | Resp 17 | Ht 66.5 in | Wt 182.2 lb

## 2020-05-06 DIAGNOSIS — F418 Other specified anxiety disorders: Secondary | ICD-10-CM | POA: Diagnosis not present

## 2020-05-06 DIAGNOSIS — I10 Essential (primary) hypertension: Secondary | ICD-10-CM | POA: Diagnosis not present

## 2020-05-06 DIAGNOSIS — Z23 Encounter for immunization: Secondary | ICD-10-CM | POA: Diagnosis not present

## 2020-05-06 DIAGNOSIS — R110 Nausea: Secondary | ICD-10-CM | POA: Diagnosis not present

## 2020-05-06 MED ORDER — ONDANSETRON HCL 4 MG PO TABS: 4.0000 mg | ORAL_TABLET | Freq: Three times a day (TID) | ORAL | 0 refills | Status: DC | PRN

## 2020-05-06 NOTE — Assessment & Plan Note (Signed)
Controlled hypertension.  BP is at goal < 130/80.  Pt is working on lifestyle modifications.  Taking medications tolerating well without side effects.  Complications:  Overweight, HLD, CKD stage 4 (following with Dr. Juleen China), tobacco use  Plan: 1. Continue taking amlodipine 10mg  daily and metoprolol succinate (Toprol-XL) 25mg  daily 2. Obtain labs at next visit 3. Encouraged heart healthy diet and increasing exercise to 30 minutes most days of the week, going no more than 2 days in a row without exercise. 4. Check BP 1-2 x per week at home, keep log, and bring to clinic at next appointment. 5. Follow up 3 months.

## 2020-05-06 NOTE — Assessment & Plan Note (Signed)
Requesting refill on PRN zofran rx for nausea associated with allergy exacerbation.  Will send Rx refill to pharmacy on file.

## 2020-05-06 NOTE — Progress Notes (Signed)
Subjective:    Patient ID: Vanessa Camacho, female    DOB: Feb 21, 1956, 64 y.o.   MRN: [id number]  Vanessa Camacho is a 64 y.o. female presenting on 05/06/2020 for Depression and Hypertension  HPI  Vanessa Camacho presents to clinic for a follow up on her depression, hypertension and for a refill on her zofran.  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  Requesting to refill her zofran today.  Reports her depression has been well managed with her prescription of citalopram 20mg  daily and PRN usage of hydroxyzine 10mg  TID PRN.  Hypertension - She is not checking BP at home or outside of clinic.    - Current medications: amlodipine 10mg  daily and metoprolol succinate 25mg  daily, tolerating well without side effects - She is not currently symptomatic. - Pt denies headache, lightheadedness, dizziness, changes in vision, chest tightness/pressure, palpitations, leg swelling, sudden loss of speech or loss of consciousness. - She  reports no regular exercise routine. - Her diet is high in salt, high in fat, and high in carbohydrates.  Depression screen [facility] 2/9 05/06/2020 01/25/2020 01/16/2020  Decreased Interest 1 0 0  Down, Depressed, Hopeless 1 0 0  PHQ - 2 Score 2 0 0  Altered sleeping 1 1 0  Tired, decreased energy 2 0 0  Change in appetite 3 1 0  Feeling bad or failure about yourself  0 0 0  Trouble concentrating 1 0 0  Moving slowly or fidgety/restless 2 0 0  Suicidal thoughts 0 0 0  PHQ-9 Score 11 2 0  Difficult doing work/chores Somewhat difficult Not difficult at all Not difficult at all  Some recent data might be hidden    Social History   Tobacco Use  . Smoking status: Current Some Day Smoker    Packs/day: 0.25    Years: 40.00    Pack years: 10.00    Types: Cigarettes    Last attempt to quit: 01/01/2015    Years since quitting: 5.3  . Smokeless tobacco: Never Used  Vaping Use  .  Vaping Use: Never used  Substance Use Topics  . Alcohol use: Not Currently    Alcohol/week: 0.0 standard drinks    Comment: occassional  . Drug use: Yes    Frequency: 3.0 times per week    Types: Marijuana    Comment: enjoyment    Review of Systems  Constitutional: Negative.   HENT: Negative.   Eyes: Negative.   Respiratory: Negative.   Cardiovascular: Negative.   Gastrointestinal: Negative.   Endocrine: Negative.   Genitourinary: Negative.   Musculoskeletal: Negative.   Skin: Negative.   Allergic/Immunologic: Negative.   Neurological: Negative.   Hematological: Negative.   Psychiatric/Behavioral: Negative.    Per HPI unless specifically indicated above     Objective:    BP 120/70 (BP Location: Right Arm, Patient Position: Sitting, Cuff Size: Normal)   Pulse 71   Temp 98.7 F (37.1 C) (Oral)   Resp 17   Ht 5' 6.5" (1.689 m)   Wt 182 lb 3.2 oz (82.6 kg)   SpO2 98%   BMI 28.97 kg/m   Wt Readings from Last 3 Encounters:  05/06/20 182 lb 3.2 oz (82.6 kg)  01/25/20 183 lb 9.6 oz (83.3 kg)  01/16/20 175 lb (79.4 kg)    Physical Exam Vitals and nursing note reviewed.  Constitutional:      General:  She is not in acute distress.    Appearance: Normal appearance. She is well-developed, well-groomed and overweight. She is not ill-appearing or toxic-appearing.  HENT:     Head: Normocephalic and atraumatic.     Nose:     Comments: Vanessa Camacho is in place, covering mouth and nose. Eyes:     General: Lids are normal. Vision grossly intact.        Right eye: No discharge.        Left eye: No discharge.     Extraocular Movements: Extraocular movements intact.     Conjunctiva/sclera: Conjunctivae normal.     Pupils: Pupils are equal, round, and reactive to light.  Cardiovascular:     Rate and Rhythm: Normal rate and regular rhythm.     Pulses: Normal pulses.     Heart sounds: Normal heart sounds. No murmur heard.  No friction rub. No gallop.   Pulmonary:     Effort:  Pulmonary effort is normal. No respiratory distress.     Breath sounds: Normal breath sounds.  Skin:    General: Skin is warm and dry.     Capillary Refill: Capillary refill takes less than 2 seconds.  Neurological:     General: No focal deficit present.     Mental Status: She is alert and oriented to person, place, and time.  Psychiatric:        Attention and Perception: Attention and perception normal.        Mood and Affect: Mood and affect normal.        Speech: Speech normal.        Behavior: Behavior normal. Behavior is cooperative.        Thought Content: Thought content normal.        Cognition and Memory: Cognition and memory normal.        Judgment: Judgment normal.    Results for orders placed or performed in visit on 01/25/20  CBC with Differential  Result Value Ref Range   WBC 7.8 3.8 - 10.8 Thousand/uL   RBC 4.63 3.80 - 5.10 Million/uL   Hemoglobin 13.9 11.7 - 15.5 g/dL   HCT 43.5 35 - 45 %   MCV 94.0 80.0 - 100.0 fL   MCH 30.0 27.0 - 33.0 pg   MCHC 32.0 32.0 - 36.0 g/dL   RDW 12.0 11.0 - 15.0 %   Platelets 134 (L) 140 - 400 Thousand/uL   MPV 11.3 7.5 - 12.5 fL   Neutro Abs 5,047 1,500 - 7,800 cells/uL   Lymphs Abs 1,997 850 - 3,900 cells/uL   Absolute Monocytes 546 200 - 950 cells/uL   Eosinophils Absolute 179 15.0 - 500.0 cells/uL   Basophils Absolute 31 0.0 - 200.0 cells/uL   Neutrophils Relative % 64.7 %   Total Lymphocyte 25.6 %   Monocytes Relative 7.0 %   Eosinophils Relative 2.3 %   Basophils Relative 0.4 %  COMPLETE METABOLIC PANEL WITH GFR  Result Value Ref Range   Glucose, Bld 110 (H) 65 - 99 mg/dL   BUN 31 (H) 7 - 25 mg/dL   Creat 2.55 (H) 0.50 - 0.99 mg/dL   GFR, Est Non African American 19 (L) > OR = 60 mL/min/1.58m2   GFR, Est African American 22 (L) > OR = 60 mL/min/1.45m2   BUN/Creatinine Ratio 12 6 - 22 (calc)   Sodium 139 135 - 146 mmol/L   Potassium 4.3 3.5 - 5.3 mmol/L   Chloride 107 98 - 110 mmol/L   CO2  24 20 - 32 mmol/L    Calcium 9.2 8.6 - 10.4 mg/dL   Total Protein 6.4 6.1 - 8.1 g/dL   Albumin 4.1 3.6 - 5.1 g/dL   Globulin 2.3 1.9 - 3.7 g/dL (calc)   AG Ratio 1.8 1.0 - 2.5 (calc)   Total Bilirubin 0.5 0.2 - 1.2 mg/dL   Alkaline phosphatase (APISO) 75 37 - 153 U/L   AST 11 10 - 35 U/L   ALT 9 6 - 29 U/L  Lipid Profile  Result Value Ref Range   Cholesterol 111 <200 mg/dL   HDL 51 > OR = 50 mg/dL   Triglycerides 51 <150 mg/dL   LDL Cholesterol (Calc) 47 mg/dL (calc)   Total CHOL/HDL Ratio 2.2 <5.0 (calc)   Non-HDL Cholesterol (Calc) 60 <130 mg/dL (calc)  Thyroid Panel With TSH  Result Value Ref Range   T3 Uptake 31 22 - 35 %   T4, Total 9.4 5.1 - 11.9 mcg/dL   Free Thyroxine Index 2.9 1.4 - 3.8   TSH 1.40 0.40 - 4.50 mIU/L  VITAMIN D 25 Hydroxy (Vit-D Deficiency, Fractures)  Result Value Ref Range   Vit D, 25-Hydroxy 25 (L) 30 - 100 ng/mL  Hemoglobin A1C w/out eAG  Result Value Ref Range   Hgb A1c MFr Bld 5.4 <5.7 % of total Hgb  Cytology - PAP  Result Value Ref Range   High risk HPV Negative    Adequacy      Satisfactory for evaluation; transformation zone component PRESENT.   Diagnosis      - Negative for intraepithelial lesion or malignancy (NILM)   Comment Normal Reference Range HPV - Negative       Assessment & Plan:   Problem List Items Addressed This Visit      Cardiovascular and Mediastinum   Essential hypertension - Primary    Controlled hypertension.  BP is at goal < 130/80.  Pt is working on lifestyle modifications.  Taking medications tolerating well without side effects.  Complications:  Overweight, HLD, CKD stage 4 (following with Dr. Juleen China), tobacco use  Plan: 1. Continue taking amlodipine 10mg  daily and metoprolol succinate (Toprol-XL) 25mg  daily 2. Obtain labs at next visit 3. Encouraged heart healthy diet and increasing exercise to 30 minutes most days of the week, going no more than 2 days in a row without exercise. 4. Check BP 1-2 x per week at home, keep log,  and bring to clinic at next appointment. 5. Follow up 3 months.         Other   Anxiety associated with depression    PHQ9-11/GAD7-15.  Increased from PHQ9-2 on 01/25/2020, although patient reports symptoms are well controlled and stable with citalopram 20mg  daily and hydroxyzine 10mg  TID PRN.  Denies any SI/HI.  Plan: 1. Continue citalopram 20mg  daily and hydroxyzine 10mg  TID PRN 2. Review mood handout 3. RTC in 3 months      Nausea    Requesting refill on PRN zofran rx for nausea associated with allergy exacerbation.  Will send Rx refill to pharmacy on file.      Relevant Medications   ondansetron (ZOFRAN) 4 MG tablet   Needs flu shot    Pt < age 65.  Needs annual influenza vaccine.  VIS provided.  Plan: 1. Administer Quad flu vaccine.       Relevant Orders   Flu Vaccine QUAD 6+ mos PF IM (Fluarix Quad PF) (Completed)      Meds ordered this encounter  Medications  .  ondansetron (ZOFRAN) 4 MG tablet    Sig: Take 1 tablet (4 mg total) by mouth every 8 (eight) hours as needed for nausea or vomiting.    Dispense:  20 tablet    Refill:  0   Follow up plan: Return in about 3 months (around 08/05/2020) for Depression & HTN F/U.   Harlin Rain, [place] [facility] Nurse Practitioner [facility] 05/06/2020, 1:46 PM

## 2020-05-06 NOTE — Patient Instructions (Signed)
Continue your medications as prescribed.  Try to get exercise a minimum of 30 minutes per day at least 5 days per week as well as  adequate water intake all while measuring blood pressure a few times per week.  Keep a blood pressure log and bring back to clinic at your next visit.  If your readings are consistently over 130/80 to contact our office/send me a MyChart message and we will see you sooner.  Can try DASH and Mediterranean diet options, avoiding processed foods, lowering sodium intake, avoiding pork products, and eating a plant based diet for optimal health.  I have sent in a refill on your zofran to have on hand if you have any additional nausea.  We will plan to see you back in 3 months for depression and hypertension follow up visit  You will receive a survey after today's visit either digitally by e-mail or paper by [place] mail. Your experiences and feedback matter to [place].  Please respond so we know how we are doing as we provide care for you.  Call us with any questions/concerns/needs.  It is my goal to be available to you for your health concerns.  Thanks for choosing me to be a partner in your healthcare needs!  Harlin Rain, FNP-C Family Nurse Practitioner [facility] Phone: [phone]

## 2020-05-06 NOTE — Assessment & Plan Note (Signed)
PHQ9-11/GAD7-15.  Increased from PHQ9-2 on 01/25/2020, although patient reports symptoms are well controlled and stable with citalopram 20mg  daily and hydroxyzine 10mg  TID PRN.  Denies any SI/HI.  Plan: 1. Continue citalopram 20mg  daily and hydroxyzine 10mg  TID PRN 2. Review mood handout 3. RTC in 3 months

## 2020-05-06 NOTE — Assessment & Plan Note (Signed)
Pt < age 64.  Needs annual influenza vaccine.  VIS provided.  Plan: 1. Administer Quad flu vaccine.

## 2020-06-16 ENCOUNTER — Other Ambulatory Visit: Payer: Self-pay | Admitting: Family Medicine

## 2020-06-16 DIAGNOSIS — F418 Other specified anxiety disorders: Secondary | ICD-10-CM

## 2020-06-16 NOTE — Telephone Encounter (Signed)
Requested Prescriptions  Pending Prescriptions Disp Refills  . citalopram (CELEXA) 20 MG tablet [Pharmacy Med Name: CITALOPRAM HBR 20 MG TABLET] 90 tablet 0    Sig: TAKE 1 TABLET BY MOUTH EVERY DAY     Psychiatry:  Antidepressants - SSRI Passed - 06/16/2020  9:24 AM      Passed - Completed PHQ-2 or PHQ-9 in the last 360 days      Passed - Valid encounter within last 6 months    Recent Outpatient Visits          1 month ago Essential hypertension   [place], FNP   4 months ago Annual physical exam   [facility], Lupita Raider, FNP   8 months ago Encounter for screening mammogram for malignant neoplasm of breast   [facility], Lupita Raider, FNP   1 year ago Anxiety associated with depression   [place], DO   1 year ago [facility] Merrilyn Puma, Jerrel Ivory, NP

## 2020-08-05 ENCOUNTER — Ambulatory Visit: Payer: Medicare HMO | Admitting: Family Medicine

## 2020-09-12 DIAGNOSIS — R531 Weakness: Secondary | ICD-10-CM | POA: Diagnosis not present

## 2020-09-12 DIAGNOSIS — N189 Chronic kidney disease, unspecified: Secondary | ICD-10-CM | POA: Diagnosis not present

## 2020-09-12 DIAGNOSIS — R197 Diarrhea, unspecified: Secondary | ICD-10-CM | POA: Diagnosis not present

## 2020-09-12 DIAGNOSIS — R1110 Vomiting, unspecified: Secondary | ICD-10-CM | POA: Diagnosis not present

## 2020-09-12 DIAGNOSIS — F17210 Nicotine dependence, cigarettes, uncomplicated: Secondary | ICD-10-CM | POA: Diagnosis not present

## 2020-09-12 DIAGNOSIS — R112 Nausea with vomiting, unspecified: Secondary | ICD-10-CM | POA: Diagnosis not present

## 2020-09-12 DIAGNOSIS — N184 Chronic kidney disease, stage 4 (severe): Secondary | ICD-10-CM | POA: Diagnosis not present

## 2020-09-30 ENCOUNTER — Encounter: Payer: Self-pay | Admitting: Internal Medicine

## 2020-09-30 ENCOUNTER — Ambulatory Visit: Payer: Medicare HMO | Admitting: Family Medicine

## 2020-09-30 ENCOUNTER — Ambulatory Visit (INDEPENDENT_AMBULATORY_CARE_PROVIDER_SITE_OTHER): Payer: Medicare HMO | Admitting: Internal Medicine

## 2020-09-30 ENCOUNTER — Other Ambulatory Visit: Payer: Self-pay

## 2020-09-30 DIAGNOSIS — F418 Other specified anxiety disorders: Secondary | ICD-10-CM

## 2020-09-30 DIAGNOSIS — I6930 Unspecified sequelae of cerebral infarction: Secondary | ICD-10-CM | POA: Diagnosis not present

## 2020-09-30 DIAGNOSIS — N184 Chronic kidney disease, stage 4 (severe): Secondary | ICD-10-CM | POA: Diagnosis not present

## 2020-09-30 DIAGNOSIS — F17200 Nicotine dependence, unspecified, uncomplicated: Secondary | ICD-10-CM | POA: Diagnosis not present

## 2020-09-30 DIAGNOSIS — I10 Essential (primary) hypertension: Secondary | ICD-10-CM

## 2020-09-30 DIAGNOSIS — E7800 Pure hypercholesterolemia, unspecified: Secondary | ICD-10-CM | POA: Diagnosis not present

## 2020-09-30 DIAGNOSIS — E875 Hyperkalemia: Secondary | ICD-10-CM | POA: Diagnosis not present

## 2020-09-30 DIAGNOSIS — I129 Hypertensive chronic kidney disease with stage 1 through stage 4 chronic kidney disease, or unspecified chronic kidney disease: Secondary | ICD-10-CM | POA: Diagnosis not present

## 2020-09-30 DIAGNOSIS — N2581 Secondary hyperparathyroidism of renal origin: Secondary | ICD-10-CM

## 2020-09-30 DIAGNOSIS — J302 Other seasonal allergic rhinitis: Secondary | ICD-10-CM

## 2020-09-30 DIAGNOSIS — R809 Proteinuria, unspecified: Secondary | ICD-10-CM | POA: Diagnosis not present

## 2020-09-30 NOTE — Assessment & Plan Note (Signed)
Concern for early stages of COPD No chest xray for review Will discuss low dose lung CA screening at annual exam Encouraged smoking cessation

## 2020-09-30 NOTE — Assessment & Plan Note (Signed)
No residual effects Continue Amlodipine, Metoprolol, ASA and Atorvastatin Will monitor

## 2020-09-30 NOTE — Assessment & Plan Note (Signed)
Lipid profile reviewed, will repeat at annual exam Encouraged her to consume a low fat diet Atorvastatin refilled today

## 2020-09-30 NOTE — Assessment & Plan Note (Signed)
Controlled on Amlodipine and Metoprolol Reinforced DASH diet and exercise for weight loss BMET reviewed

## 2020-09-30 NOTE — Assessment & Plan Note (Signed)
Continue Rocaltrol BMET reviewed

## 2020-09-30 NOTE — Assessment & Plan Note (Signed)
Stable on Citalopram and Hydroxyzine Discussed ways to manage stress Support offered

## 2020-09-30 NOTE — Patient Instructions (Signed)
Managing the Challenge of Quitting Smoking Quitting smoking is a physical and mental challenge. You will face cravings, withdrawal symptoms, and temptation. Before quitting, work with your health care provider to make a plan that can help you manage quitting. Preparation can help you quit and keep you from giving in. How to manage lifestyle changes Managing stress Stress can make you want to smoke, and wanting to smoke may cause stress. It is important to find ways to manage your stress. You might try some of the following:  Practice relaxation techniques. ? Breathe slowly and deeply, in through your nose and out through your mouth. ? Listen to music. ? Soak in a bath or take a shower. ? Imagine a peaceful place or vacation.  Get some support. ? Talk with family or friends about your stress. ? Join a support group. ? Talk with a counselor or therapist.  Get some physical activity. ? Go for a walk, run, or bike ride. ? Play a favorite sport. ? Practice yoga.   Medicines Talk with your health care provider about medicines that might help you deal with cravings and make quitting easier for you. Relationships Social situations can be difficult when you are quitting smoking. To manage this, you can:  Avoid parties and other social situations where people might be smoking.  Avoid alcohol.  Leave right away if you have the urge to smoke.  Explain to your family and friends that you are quitting smoking. Ask for support and let them know you might be a bit grumpy.  Plan activities where smoking is not an option. General instructions Be aware that many people gain weight after they quit smoking. However, not everyone does. To keep from gaining weight, have a plan in place before you quit and stick to the plan after you quit. Your plan should include:  Having healthy snacks. When you have a craving, it may help to: ? Eat popcorn, carrots, celery, or other cut vegetables. ? Chew  sugar-free gum.  Changing how you eat. ? Eat small portion sizes at meals. ? Eat 4-6 small meals throughout the day instead of 1-2 large meals a day. ? Be mindful when you eat. Do not watch television or do other things that might distract you as you eat.  Exercising regularly. ? Make time to exercise each day. If you do not have time for a long workout, do short bouts of exercise for 5-10 minutes several times a day. ? Do some form of strengthening exercise, such as weight lifting. ? Do some exercise that gets your heart beating and causes you to breathe deeply, such as walking fast, running, swimming, or biking. This is very important.  Drinking plenty of water or other low-calorie or no-calorie drinks. Drink 6-8 glasses of water daily.   How to recognize withdrawal symptoms Your body and mind may experience discomfort as you try to get used to not having nicotine in your system. These effects are called withdrawal symptoms. They may include:  Feeling hungrier than normal.  Having trouble concentrating.  Feeling irritable or restless.  Having trouble sleeping.  Feeling depressed.  Craving a cigarette. To manage withdrawal symptoms:  Avoid places, people, and activities that trigger your cravings.  Remember why you want to quit.  Get plenty of sleep.  Avoid coffee and other caffeinated drinks. These may worsen some of your symptoms. These symptoms may surprise you. But be assured that they are normal to have when quitting smoking. How to manage cravings   Come up with a plan for how to deal with your cravings. The plan should include the following:  A definition of the specific situation you want to deal with.  An alternative action you will take.  A clear idea for how this action will help.  The name of someone who might help you with this. Cravings usually last for 5-10 minutes. Consider taking the following actions to help you with your plan to deal with  cravings:  Keep your mouth busy. ? Chew sugar-free gum. ? Suck on hard candies or a straw. ? Brush your teeth.  Keep your hands and body busy. ? Change to a different activity right away. ? Squeeze or play with a ball. ? Do an activity or a hobby, such as making bead jewelry, practicing needlepoint, or working with wood. ? Mix up your normal routine. ? Take a short exercise break. Go for a quick walk or run up and down stairs.  Focus on doing something kind or helpful for someone else.  Call a friend or family member to talk during a craving.  Join a support group.  Contact a quitline. Where to find support To get help or find a support group:  Call the National Cancer Institute's Smoking Quitline: 1-800-QUIT NOW (784-8669)  Visit the website of the Substance Abuse and Mental Health Services Administration: www.samhsa.gov  Text QUIT to SmokefreeTXT: 478848 Where to find more information Visit these websites to find more information on quitting smoking:  National Cancer Institute: www.smokefree.gov  American Lung Association: www.lung.org  American Cancer Society: www.cancer.org  Centers for Disease Control and Prevention: www.cdc.gov  American Heart Association: www.heart.org Contact a health care provider if:  You want to change your plan for quitting.  The medicines you are taking are not helping.  Your eating feels out of control or you cannot sleep. Get help right away if:  You feel depressed or become very anxious. Summary  Quitting smoking is a physical and mental challenge. You will face cravings, withdrawal symptoms, and temptation to smoke again. Preparation can help you as you go through these challenges.  Try different techniques to manage stress, handle social situations, and prevent weight gain.  You can deal with cravings by keeping your mouth busy (such as by chewing gum), keeping your hands and body busy, calling family or friends, or  contacting a quitline for people who want to quit smoking.  You can deal with withdrawal symptoms by avoiding places where people smoke, getting plenty of rest, and avoiding drinks with caffeine. This information is not intended to replace advice given to you by your health care provider. Make sure you discuss any questions you have with your health care provider. Document Revised: 03/14/2019 Document Reviewed: 03/14/2019 Elsevier Patient Education  2021 Elsevier Inc.  

## 2020-09-30 NOTE — Assessment & Plan Note (Signed)
Continue daily Loratadine Will monitor symptoms

## 2020-09-30 NOTE — Progress Notes (Signed)
Subjective:    Patient ID: Vanessa Camacho, female    DOB: 1955-10-17, 65 y.o.   MRN: [id number]  HPI  Patient presents to the clinic today for 43-monthfollow-up of chronic conditions.  She is establishing care with me today, transferring care from NCyndia Skeeters NP.  Anxiety and Depression: She has been under a lot of stress lately. Managed with Citalopram and Hydroxyzine.  She is not currently seeing a therapist.  She denies SI/HI.  HTN: Her BP today is 124/67.  She is taking Amlodipine and Metoprolol as prescribed.  There is no ECG on file.  HLD status post Stroke: No residual effect. Her last LDL was 47, 01/2020.  She denies myalgias on Atorvastatin.  She is taking Metoprolol and Aspirin as prescribed.  She tries to consume a low-fat diet.  Seasonal Allergies: Worse in the spring.  She takes Loratadine as needed with good relief of symptoms.  CKD IV: Her last creatinine was 2.27, GFR 22, 09/2020.  She is not currently taking an ACEI/ARB. She followed up with her nephrologist earlier today.  Secondary Hyperparathyroidism: Secondary to CKD.  She is taking Rocaltrol as prescribed.  Review of Systems      Past Medical History:  Diagnosis Date  . Allergy   . Anxiety   . Arthritis   . Chronic kidney disease    stage 4 [place] . [place]   . Hypertension   [place]   . Urinary incontinence     Current Outpatient Medications  Medication Sig Dispense Refill  . amLODipine (NORVASC) 10 MG tablet TAKE 1 TABLET BY MOUTH EVERY DAY 90 tablet 1  . aspirin 81 MG chewable tablet Chew by mouth daily.    .Marland Kitchenatorvastatin (LIPITOR) 20 MG tablet TAKE 1 TABLET BY MOUTH EVERY DAY 90 tablet 1  . calcitRIOL (ROCALTROL) 0.25 MCG capsule Take 0.25 mcg by mouth every other day.    . citalopram (CELEXA) 20 MG tablet TAKE 1 TABLET BY MOUTH EVERY DAY 90 tablet 0  . hydrOXYzine (ATARAX/VISTARIL) 10 MG tablet Take 10 mg by mouth 3 (three) times daily as needed.     . loratadine (CLARITIN) 10 MG tablet TAKE 1 TABLET BY MOUTH EVERY DAY 90 tablet 3  . metoprolol succinate (TOPROL-XL) 25 MG 24 hr tablet TAKE 1 TABLET BY MOUTH EVERY DAY 90 tablet 0  . ondansetron (ZOFRAN) 4 MG tablet Take 1 tablet (4 mg total) by mouth every 8 (eight) hours as needed for nausea or vomiting. 20 tablet 0   No current facility-administered medications for this visit.    No Known Allergies  Family History  Problem Relation Age of Onset  . Stroke Mother   . Dementia Mother   . Hypertension Mother   . Cancer Father   . Brain cancer Father   . Skin cancer Sister     Social History   Socioeconomic History  . Marital status: Married    Spouse name: Not on file  . Number of children: Not on file  . Years of education: Not on file  . Highest education level: Not on file  Occupational History  . Not on file  Tobacco Use  . Smoking status: Current Some Day Smoker    Packs/day: 0.25    Years: 40.00    Pack years: 10.00    Types: Cigarettes    Last attempt to quit: 01/01/2015    Years since quitting: 5.7  . Smokeless tobacco:  Never Used  Vaping Use  . Vaping Use: Never used  Substance and Sexual Activity  . Alcohol use: Not Currently    Alcohol/week: 0.0 standard drinks    Comment: occassional  . Drug use: Yes    Frequency: 3.0 times per week    Types: Marijuana    Comment: enjoyment  . Sexual activity: Not Currently  Other Topics Concern  . Not on file  Social History Narrative  . Not on file   Social Determinants of Health   Financial Resource Strain: Low Risk   . Difficulty of Paying Living Expenses: Not hard at all  Food Insecurity: No Food Insecurity  . Worried About Charity fundraiser in the Last Year: Never true  . Ran Out of Food in the Last Year: Never true  Transportation Needs: No Transportation Needs  . Lack of Transportation (Medical): No  . Lack of Transportation (Non-Medical): No  Physical Activity: Inactive  . Days of Exercise  per Week: 0 days  . Minutes of Exercise per Session: 0 min  Stress: No Stress Concern Present  . Feeling of Stress : Not at all  Social Connections: Not on file  Intimate Partner Violence: Not on file     Constitutional: Denies fever, malaise, fatigue, headache or abrupt weight changes.  HEENT: Denies eye pain, eye redness, ear pain, ringing in the ears, wax buildup, runny nose, nasal congestion, bloody nose, or sore throat. Respiratory: Pt reports intermittent cough. Denies difficulty breathing, shortness of breath, or sputum production.   Cardiovascular: Denies chest pain, chest tightness, palpitations or swelling in the hands or feet.  Skin: Denies redness, rashes, lesions or ulcercations.  Neurological: Denies dizziness, difficulty with memory, difficulty with speech or problems with balance and coordination.  Psych: Patient has a history of anxiety and depression.  Denies SI/HI.  No other specific complaints in a complete review of systems (except as listed in HPI above).  Objective:   Physical Exam  BP 124/67 (BP Location: Right Arm, Patient Position: Sitting, Cuff Size: Normal)   Pulse 95   Temp 97.7 F (36.5 C) (Temporal)   Resp 18   Ht 5' 6.5" (1.689 m)   Wt 174 lb 3.2 oz (79 kg)   SpO2 97%   BMI 27.70 kg/m   Wt Readings from Last 3 Encounters:  05/06/20 182 lb 3.2 oz (82.6 kg)  01/25/20 183 lb 9.6 oz (83.3 kg)  01/16/20 175 lb (79.4 kg)    General: Appears her stated age, well developed, well nourished in NAD. Skin: Warm, dry and intact. No rashes noted. HEENT: Head: normal shape and size; Eyes: sclera white, and EOMs intact;  Neck:  Neck supple, trachea midline. No masses, lumps or thyromegaly present.  Cardiovascular: Normal rate and rhythm. S1,S2 noted.  No murmur, rubs or gallops noted. No JVD or BLE edema. No carotid bruits noted. Pulmonary/Chest: Normal effort and with slightly diminished breath sounds (concerning for early stage COPD). No respiratory  distress. No wheezes, rales or ronchi noted.  Musculoskeletal:No difficulty with gait.  Neurological: Alert and oriented.  Psychiatric: Mood and affect normal. Behavior is normal. Judgment and thought content normal.     BMET    Component Value Date/Time   NA 139 01/25/2020 0941   NA 144 12/21/2014 0907   NA 143 04/11/2014 1344   K 4.3 01/25/2020 0941   K 4.8 04/11/2014 1344   CL 107 01/25/2020 0941   CL 108 (H) 04/11/2014 1344   CO2 24 01/25/2020 0941  CO2 27 04/11/2014 1344   GLUCOSE 110 (H) 01/25/2020 0941   GLUCOSE 132 (H) 04/11/2014 1344   BUN 31 (H) 01/25/2020 0941   BUN 30 (H) 12/21/2014 0907   BUN 38 (H) 04/11/2014 1344   CREATININE 2.55 (H) 01/25/2020 0941   CALCIUM 9.2 01/25/2020 0941   CALCIUM 8.7 04/11/2014 1344   GFRNONAA 19 (L) 01/25/2020 0941   GFRAA 22 (L) 01/25/2020 0941    Lipid Panel     Component Value Date/Time   CHOL 111 01/25/2020 0941   CHOL 124 12/21/2014 0907   TRIG 51 01/25/2020 0941   HDL 51 01/25/2020 0941   HDL 40 12/21/2014 0907   CHOLHDL 2.2 01/25/2020 0941   LDLCALC 47 01/25/2020 0941    CBC    Component Value Date/Time   WBC 7.8 01/25/2020 0941   RBC 4.63 01/25/2020 0941   HGB 13.9 01/25/2020 0941   HCT 43.5 01/25/2020 0941   PLT 134 (L) 01/25/2020 0941   MCV 94.0 01/25/2020 0941   MCH 30.0 01/25/2020 0941   MCHC 32.0 01/25/2020 0941   RDW 12.0 01/25/2020 0941   LYMPHSABS 1,997 01/25/2020 0941   MONOABS 0.7 08/03/2019 1049   EOSABS 179 01/25/2020 0941   BASOSABS 31 01/25/2020 0941    Hgb A1C Lab Results  Component Value Date   HGBA1C 5.4 01/25/2020          Assessment & Plan:    Webb Silversmith, NP This visit occurred during the SARS-CoV-2 public health emergency.  Safety protocols were in place, including screening questions prior to the visit, additional usage of staff PPE, and extensive cleaning of exam room while observing appropriate contact time as indicated for disinfecting solutions.

## 2020-09-30 NOTE — Assessment & Plan Note (Signed)
BMET reviewed She will continue to follow with nephrology

## 2020-10-02 ENCOUNTER — Other Ambulatory Visit: Payer: Self-pay | Admitting: Internal Medicine

## 2020-10-02 DIAGNOSIS — E785 Hyperlipidemia, unspecified: Secondary | ICD-10-CM

## 2020-10-02 MED ORDER — ATORVASTATIN CALCIUM 20 MG PO TABS: 1.0000 | ORAL_TABLET | Freq: Every day | ORAL | 1 refills | Status: DC

## 2020-10-08 IMAGING — CR DG ABDOMEN 1V
2 series · 2 of 2 positions shown · non-contrast
Comparison: None.

CLINICAL DATA: Vomiting and diarrhea

EXAM:
ABDOMEN - 1 VIEW

[abdomen kub (1 of 2)]
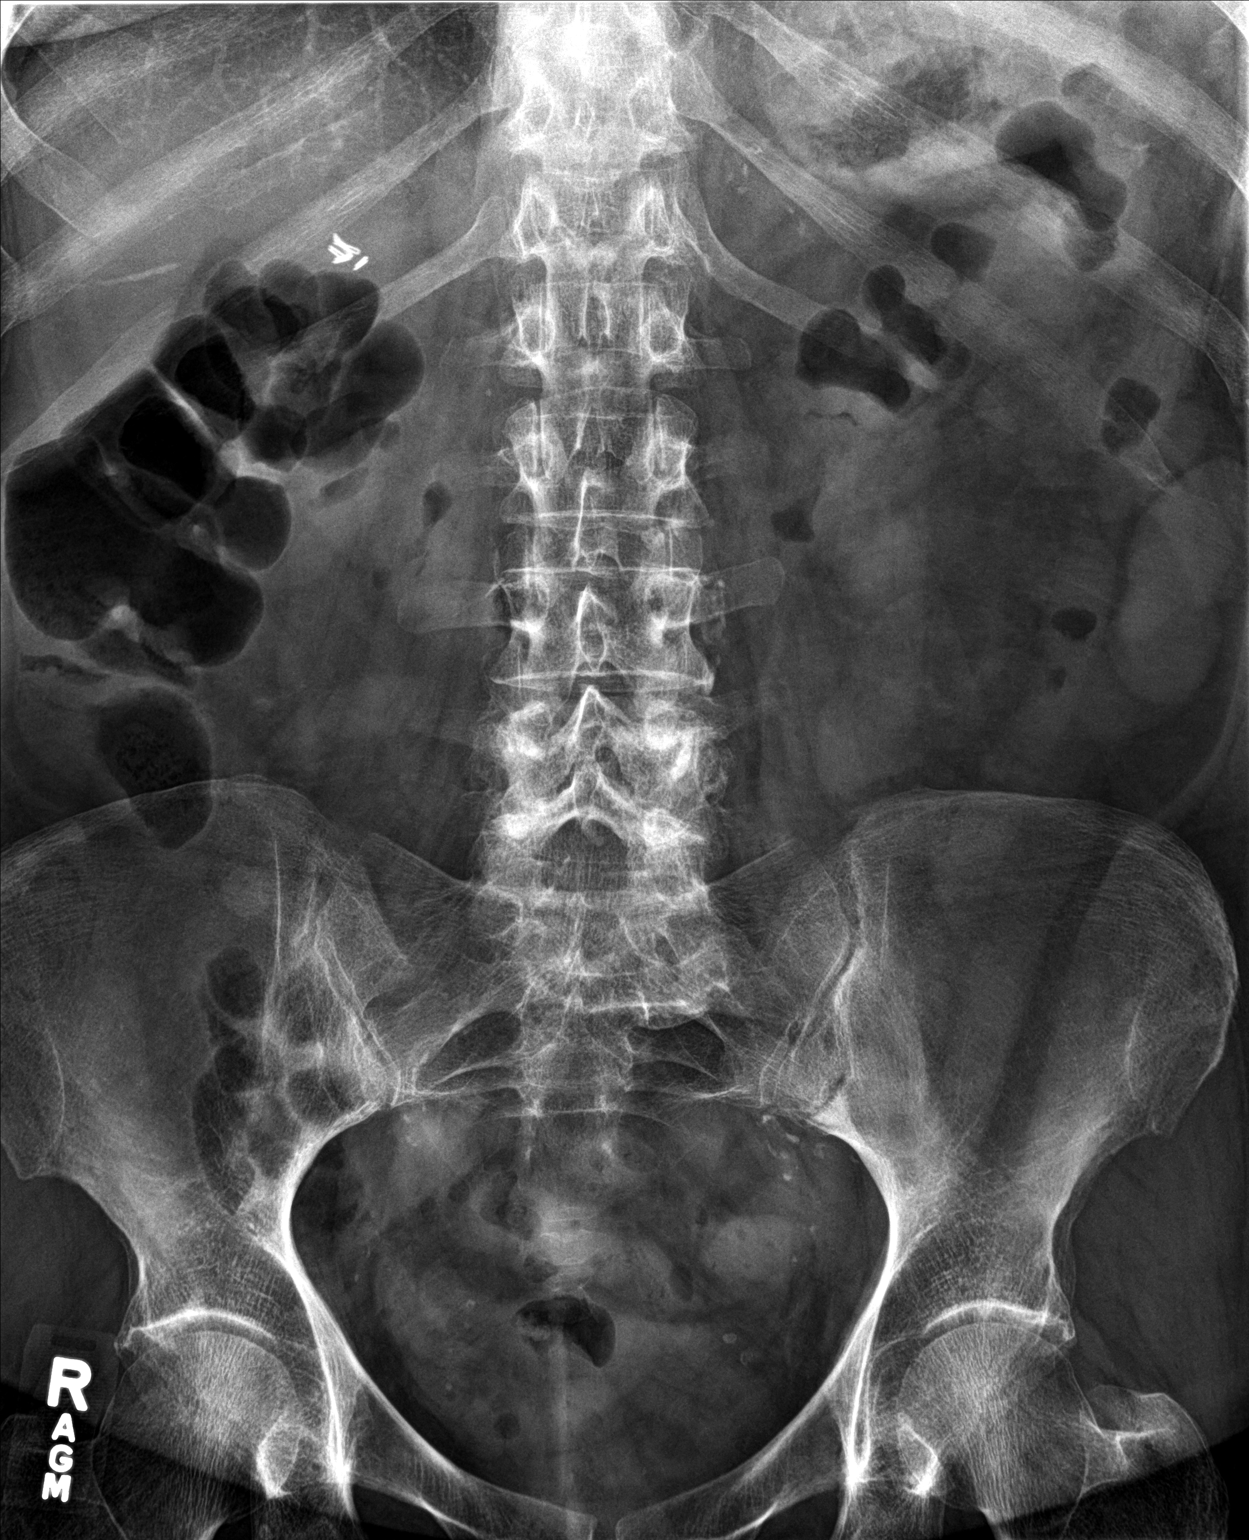

[abdomen kub (2 of 2)]
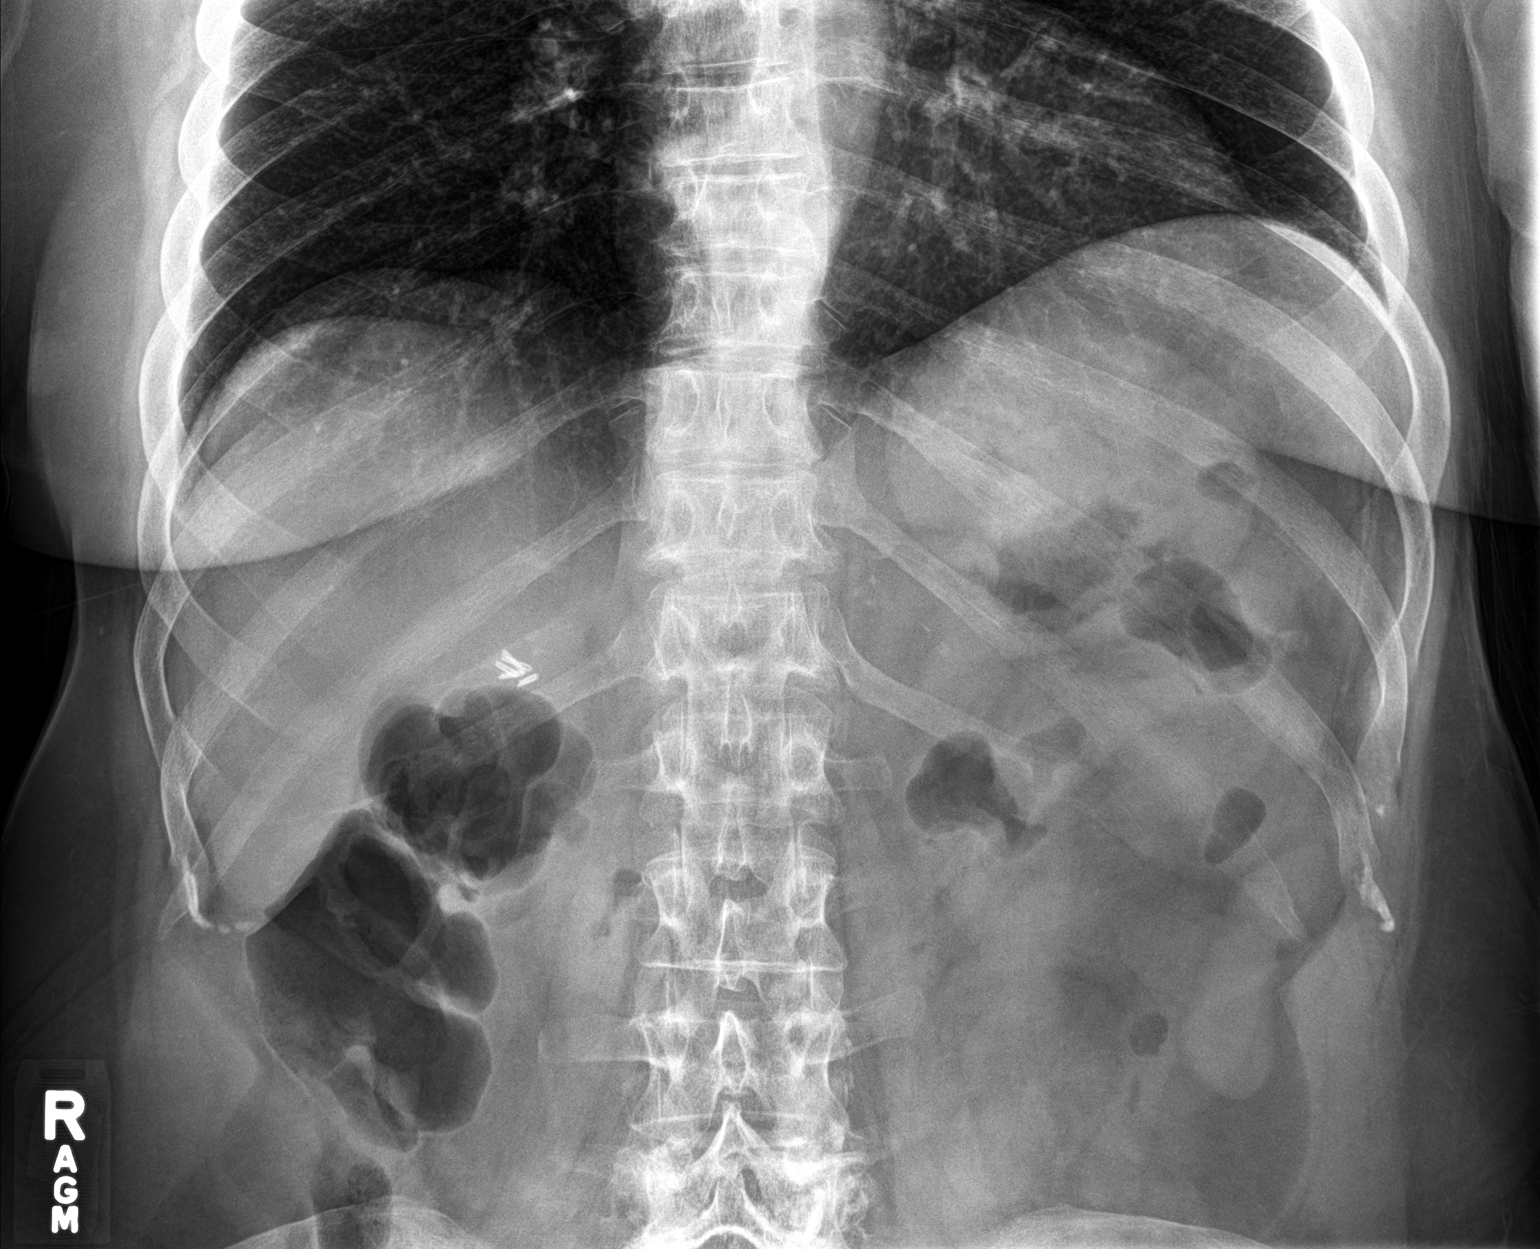

[2 of 2 positions shown; findings below may reference images not displayed]

FINDINGS: There is a mild volume of stool in the colon. There is no bowel
dilatation or air-fluid level to suggest bowel obstruction. No free
air. There are multiple vascular calcifications in the pelvis and
left upper quadrant. There are surgical clips in the gallbladder
fossa region. Lung bases are clear.
IMPRESSION: No bowel obstruction or free air.  Lung bases clear.

## 2020-11-21 ENCOUNTER — Other Ambulatory Visit: Payer: Self-pay

## 2020-11-21 DIAGNOSIS — F418 Other specified anxiety disorders: Secondary | ICD-10-CM

## 2020-11-21 MED ORDER — CITALOPRAM HYDROBROMIDE 20 MG PO TABS: 20.0000 mg | ORAL_TABLET | Freq: Every day | ORAL | 1 refills | Status: DC

## 2021-02-24 DIAGNOSIS — R809 Proteinuria, unspecified: Secondary | ICD-10-CM | POA: Diagnosis not present

## 2021-02-24 DIAGNOSIS — I129 Hypertensive chronic kidney disease with stage 1 through stage 4 chronic kidney disease, or unspecified chronic kidney disease: Secondary | ICD-10-CM | POA: Diagnosis not present

## 2021-02-24 DIAGNOSIS — N184 Chronic kidney disease, stage 4 (severe): Secondary | ICD-10-CM | POA: Diagnosis not present

## 2021-02-24 DIAGNOSIS — E875 Hyperkalemia: Secondary | ICD-10-CM | POA: Diagnosis not present

## 2021-02-24 DIAGNOSIS — N2581 Secondary hyperparathyroidism of renal origin: Secondary | ICD-10-CM | POA: Diagnosis not present

## 2021-02-25 DIAGNOSIS — N2581 Secondary hyperparathyroidism of renal origin: Secondary | ICD-10-CM | POA: Diagnosis not present

## 2021-02-25 DIAGNOSIS — R809 Proteinuria, unspecified: Secondary | ICD-10-CM | POA: Diagnosis not present

## 2021-02-25 DIAGNOSIS — E875 Hyperkalemia: Secondary | ICD-10-CM | POA: Diagnosis not present

## 2021-02-25 DIAGNOSIS — N184 Chronic kidney disease, stage 4 (severe): Secondary | ICD-10-CM | POA: Diagnosis not present

## 2021-02-25 DIAGNOSIS — I129 Hypertensive chronic kidney disease with stage 1 through stage 4 chronic kidney disease, or unspecified chronic kidney disease: Secondary | ICD-10-CM | POA: Diagnosis not present

## 2021-03-28 ENCOUNTER — Other Ambulatory Visit: Payer: Self-pay | Admitting: Internal Medicine

## 2021-03-28 DIAGNOSIS — E785 Hyperlipidemia, unspecified: Secondary | ICD-10-CM

## 2021-03-28 NOTE — Telephone Encounter (Signed)
Requested Prescriptions  Pending Prescriptions Disp Refills  . atorvastatin (LIPITOR) 20 MG tablet [Pharmacy Med Name: ATORVASTATIN 20 MG TABLET] 30 tablet 0    Sig: TAKE 1 TABLET BY MOUTH EVERY DAY     Cardiovascular:  Antilipid - Statins Failed - 03/28/2021  9:19 AM      Failed - Total Cholesterol in normal range and within 360 days    Cholesterol, Total  Date Value Ref Range Status  12/21/2014 124 100 - 199 mg/dL Final   Cholesterol  Date Value Ref Range Status  01/25/2020 111 <200 mg/dL Final         Failed - LDL in normal range and within 360 days    LDL Cholesterol (Calc)  Date Value Ref Range Status  01/25/2020 47 mg/dL (calc) Final    Comment:    Reference range: <100 . Desirable range <100 mg/dL for primary prevention;   <70 mg/dL for patients with CHD or diabetic patients  with > or = 2 CHD risk factors. Marland Kitchen LDL-C is now calculated using the Martin-Hopkins  calculation, which is a validated novel method providing  better accuracy than the Friedewald equation in the  estimation of LDL-C.  Cresenciano Genre et al. Annamaria Helling. 3086;578(46): 2061-2068  (http://education.[facility].com/faq/FAQ164)          Failed - HDL in normal range and within 360 days    HDL  Date Value Ref Range Status  01/25/2020 51 > OR = 50 mg/dL Final  12/21/2014 40 >39 mg/dL Final    Comment:    According to ATP-III Guidelines, HDL-C >59 mg/dL is considered a negative risk factor for CHD.          Failed - Triglycerides in normal range and within 360 days    Triglycerides  Date Value Ref Range Status  01/25/2020 51 <150 mg/dL Final         Passed - Patient is not pregnant      Passed - Valid encounter within last 12 months    Recent Outpatient Visits          5 months ago Anxiety associated with depression   [place], Coralie Keens, NP   10 months ago Essential hypertension   [place], FNP   1 year ago Annual physical exam    [facility], Lupita Raider, [place]   1 year ago Encounter for screening mammogram for malignant neoplasm of breast   [facility], Lupita Raider, FNP   1 year ago Anxiety associated with depression   [place], DO      Future Appointments            In 5 days Baity, Coralie Keens, NP [facility], [facility]

## 2021-04-02 ENCOUNTER — Ambulatory Visit (INDEPENDENT_AMBULATORY_CARE_PROVIDER_SITE_OTHER): Payer: Medicare Other | Admitting: Internal Medicine

## 2021-04-02 ENCOUNTER — Encounter: Payer: Self-pay | Admitting: Internal Medicine

## 2021-04-02 ENCOUNTER — Other Ambulatory Visit: Payer: Self-pay

## 2021-04-02 VITALS — BP 123/74 | HR 76 | Temp 98.0°F | Resp 17 | Ht 66.5 in | Wt 170.2 lb

## 2021-04-02 DIAGNOSIS — Z0000 Encounter for general adult medical examination without abnormal findings: Secondary | ICD-10-CM | POA: Diagnosis not present

## 2021-04-02 DIAGNOSIS — Z23 Encounter for immunization: Secondary | ICD-10-CM | POA: Diagnosis not present

## 2021-04-02 DIAGNOSIS — R110 Nausea: Secondary | ICD-10-CM

## 2021-04-02 DIAGNOSIS — Z114 Encounter for screening for human immunodeficiency virus [HIV]: Secondary | ICD-10-CM | POA: Diagnosis not present

## 2021-04-02 DIAGNOSIS — D692 Other nonthrombocytopenic purpura: Secondary | ICD-10-CM

## 2021-04-02 DIAGNOSIS — E7800 Pure hypercholesterolemia, unspecified: Secondary | ICD-10-CM | POA: Diagnosis not present

## 2021-04-02 DIAGNOSIS — Z1159 Encounter for screening for other viral diseases: Secondary | ICD-10-CM | POA: Diagnosis not present

## 2021-04-02 MED ORDER — ONDANSETRON HCL 4 MG PO TABS: 4.0000 mg | ORAL_TABLET | Freq: Three times a day (TID) | ORAL | 0 refills | Status: DC | PRN

## 2021-04-02 NOTE — Assessment & Plan Note (Signed)
CBC reviewed 

## 2021-04-02 NOTE — Patient Instructions (Signed)

## 2021-04-02 NOTE — Progress Notes (Signed)
HPI:  Patient presents the clinic today for her annual Medicare wellness exam.  Past Medical History:  Diagnosis Date   Allergy    Anxiety    Arthritis    Chronic kidney disease    stage 4 renal failure past urologist was in [place]   Depression    Hypertension    Stroke [facility])    Urinary incontinence     Current Outpatient Medications  Medication Sig Dispense Refill   amLODipine (NORVASC) 10 MG tablet TAKE 1 TABLET BY MOUTH EVERY DAY 90 tablet 1   aspirin 81 MG chewable tablet Chew by mouth daily.     atorvastatin (LIPITOR) 20 MG tablet TAKE 1 TABLET BY MOUTH EVERY DAY 30 tablet 0   calcitRIOL (ROCALTROL) 0.25 MCG capsule Take 0.25 mcg by mouth daily.     citalopram (CELEXA) 20 MG tablet Take 1 tablet (20 mg total) by mouth daily. 90 tablet 1   hydrOXYzine (ATARAX/VISTARIL) 10 MG tablet Take 10 mg by mouth 3 (three) times daily as needed.     loratadine (CLARITIN) 10 MG tablet TAKE 1 TABLET BY MOUTH EVERY DAY 90 tablet 3   metoprolol succinate (TOPROL-XL) 25 MG 24 hr tablet TAKE 1 TABLET BY MOUTH EVERY DAY 90 tablet 0   ondansetron (ZOFRAN) 4 MG tablet Take 1 tablet (4 mg total) by mouth every 8 (eight) hours as needed for nausea or vomiting. 20 tablet 0   No current facility-administered medications for this visit.    No Known Allergies  Family History  Problem Relation Age of Onset   Stroke Mother    Dementia Mother    Hypertension Mother    Cancer Father    Brain cancer Father    Skin cancer Sister     Social History   Socioeconomic History   Marital status: Married    Spouse name: Not on file   Number of children: Not on file   Years of education: Not on file   Highest education level: Not on file  Occupational History   Not on file  Tobacco Use   Smoking status: Some Days    Packs/day: 0.25    Years: 40.00    Pack years: 10.00    Types: Cigarettes    Last attempt to quit: 01/01/2015    Years since quitting: 6.2   Smokeless tobacco: Never  Vaping Use    Vaping Use: Never used  Substance and Sexual Activity   Alcohol use: Not Currently    Alcohol/week: 0.0 standard drinks    Comment: occassional   Drug use: Yes    Frequency: 3.0 times per week    Types: Marijuana    Comment: enjoyment   Sexual activity: Not Currently  Other Topics Concern   Not on file  Social History Narrative   Not on file   Social Determinants of Health   Financial Resource Strain: Not on file  Food Insecurity: Not on file  Transportation Needs: Not on file  Physical Activity: Not on file  Stress: Not on file  Social Connections: Not on file  Intimate Partner Violence: Not on file    Hospitiliaztions: None  Health Maintenance:    Flu: 04/2020  Tetanus: 01/2020  Pneumovax: 12/2017  Prevnar: Never  Shingrix: Never  COVID: Moderna x2  Mammogram: 12/2014  Pap Smear: 01/2020  Bone Density: Never  Colon Screening: Never  Eye Doctor: annually  Dental Exam: as needed, dentures   Providers:   PCP: Webb Silversmith, NP  Nephrologist: Dr.  Kolluru   I have personally reviewed and have noted:  1. The patient's medical and social history 2. Their use of alcohol, tobacco or illicit drugs 3. Their current medications and supplements 4. The patient's functional ability including ADL's, fall risks, home safety risks and hearing or visual impairment. 5. Diet and physical activities 6. Evidence for depression or mood disorder  Subjective:   Review of Systems:   Constitutional: Denies fever, malaise, fatigue, headache or abrupt weight changes.  HEENT: Denies eye pain, eye redness, ear pain, ringing in the ears, wax buildup, runny nose, nasal congestion, bloody nose, or sore throat. Respiratory: Denies difficulty breathing, shortness of breath, cough or sputum production.   Cardiovascular: Denies chest pain, chest tightness, palpitations or swelling in the hands or feet.  Gastrointestinal: Denies abdominal pain, bloating, constipation, diarrhea or blood in  the stool.  GU: Patient reports urinary incontinence.  Denies urgency, frequency, pain with urination, burning sensation, blood in urine, odor or discharge. Musculoskeletal: Denies decrease in range of motion, difficulty with gait, muscle pain or joint pain and swelling.  Skin: Denies redness, rashes, lesions or ulcercations.  Neurological: Denies dizziness, difficulty with memory, difficulty with speech or problems with balance and coordination.  Psych: Patient has a history of anxiety and depression.  Denies SI/HI.  No other specific complaints in a complete review of systems (except as listed in HPI above).  Objective:  PE:  BP 123/74 (BP Location: Right Arm, Patient Position: Sitting, Cuff Size: Normal)   Pulse 76   Temp 98 F (36.7 C) (Temporal)   Resp 17   Ht 5' 6.5" (1.689 m)   Wt 170 lb 3.2 oz (77.2 kg)   SpO2 97%   BMI 27.06 kg/m   Wt Readings from Last 3 Encounters:  09/30/20 174 lb 3.2 oz (79 kg)  05/06/20 182 lb 3.2 oz (82.6 kg)  01/25/20 183 lb 9.6 oz (83.3 kg)    General: Appears her stated age, awake, in NAD. Skin: Warm, dry and intact.  Purpura noted. HEENT: Head: normal shape and size; Eyes: EOMs intact;  Neck: Neck supple, trachea midline. No masses, lumps or thyromegaly present.  Cardiovascular: Normal rate and rhythm. S1,S2 noted.  No murmur, rubs or gallops noted. No JVD or BLE edema. No carotid bruits noted. Pulmonary/Chest: Normal effort and coarse vesicular breath sounds. No respiratory distress. No wheezes, rales or ronchi noted.  Abdomen: Soft and nontender. Normal bowel sounds. No distention or masses noted. Liver, spleen and kidneys non palpable. Musculoskeletal:  Strength 5/5 BUE/BLE. No difficulty with gait.  Neurological: Alert and oriented. Cranial nerves II-XII grossly intact. Coordination normal.  Psychiatric: Mood and affect normal. Behavior is normal. Judgment and thought content normal.    BMET    Component Value Date/Time   NA 139  01/25/2020 0941   NA 144 12/21/2014 0907   NA 143 04/11/2014 1344   K 4.3 01/25/2020 0941   K 4.8 04/11/2014 1344   CL 107 01/25/2020 0941   CL 108 (H) 04/11/2014 1344   CO2 24 01/25/2020 0941   CO2 27 04/11/2014 1344   GLUCOSE 110 (H) 01/25/2020 0941   GLUCOSE 132 (H) 04/11/2014 1344   BUN 31 (H) 01/25/2020 0941   BUN 30 (H) 12/21/2014 0907   BUN 38 (H) 04/11/2014 1344   CREATININE 2.55 (H) 01/25/2020 0941   CALCIUM 9.2 01/25/2020 0941   CALCIUM 8.7 04/11/2014 1344   GFRNONAA 19 (L) 01/25/2020 0941   GFRAA 22 (L) 01/25/2020 3500  Lipid Panel     Component Value Date/Time   CHOL 111 01/25/2020 0941   CHOL 124 12/21/2014 0907   TRIG 51 01/25/2020 0941   HDL 51 01/25/2020 0941   HDL 40 12/21/2014 0907   CHOLHDL 2.2 01/25/2020 0941   LDLCALC 47 01/25/2020 0941    CBC    Component Value Date/Time   WBC 7.8 01/25/2020 0941   RBC 4.63 01/25/2020 0941   HGB 13.9 01/25/2020 0941   HCT 43.5 01/25/2020 0941   PLT 134 (L) 01/25/2020 0941   MCV 94.0 01/25/2020 0941   MCH 30.0 01/25/2020 0941   MCHC 32.0 01/25/2020 0941   RDW 12.0 01/25/2020 0941   LYMPHSABS 1,997 01/25/2020 0941   MONOABS 0.7 08/03/2019 1049   EOSABS 179 01/25/2020 0941   BASOSABS 31 01/25/2020 0941    Hgb A1C Lab Results  Component Value Date   HGBA1C 5.4 01/25/2020      Assessment and Plan:   Medicare Annual Wellness Visit:  Diet: She does eat meat. She consumes fruits and veggies. She does eat some fried foods. She drinks mostly water. Physical activity: Sedentary Depression/mood screen: Chronic PHQ 9 score of 0 Hearing: Intact to whispered voice Visual acuity: Grossly normal, performs annual eye exam  ADLs: Capable Fall risk: None Home safety: Good Cognitive evaluation: Intact to orientation, naming, recall and repetition EOL planning: No adv directives, full code/ I agree  Preventative Medicine: Flu shot today.  Tetanus UTD.  Pneumovax UTD.  Prevnar 20 today.  Encouraged her to get  her Shingrix vaccine at the pharmacy.  Encouraged her to get her COVID booster.  Pap smear UTD.  She does not want to screen for breast cancer, osteoporosis or colon cancer at this time.  Encouraged her to consume a balanced diet and exercise regimen.  Advised her to see an eye doctor and dentist annually.  Will check lipid, HIV and hep C today. CBC and CMET in care everywhere reviewed.  Due dates for screening exams given to patient as part of her AVS.   Next appointment: 1 year, Medicare wellness exam   Webb Silversmith, NP This visit occurred during the SARS-CoV-2 public health emergency.  Safety protocols were in place, including screening questions prior to the visit, additional usage of staff PPE, and extensive cleaning of exam room while observing appropriate contact time as indicated for disinfecting solutions.

## 2021-04-03 LAB — LIPID PANEL
Cholesterol: 127 mg/dL (ref <200)
HDL: 53 mg/dL (ref >=50)
LDL Cholesterol (Calc): 57 mg/dL (calc)
Non-HDL Cholesterol (Calc): 74 mg/dL (calc) (ref <130)
Total CHOL/HDL Ratio: 2.4 (calc) (ref <5.0)
Triglycerides: 87 mg/dL (ref <150)

## 2021-04-03 LAB — HIV ANTIBODY (ROUTINE TESTING W REFLEX): HIV 1&2 Ab, 4th Generation: NONREACTIVE

## 2021-04-03 LAB — HEPATITIS C ANTIBODY
Hepatitis C Ab: NONREACTIVE
SIGNAL TO CUT-OFF: <0.02 (ref <1.00)

## 2021-04-07 ENCOUNTER — Other Ambulatory Visit: Payer: Self-pay

## 2021-04-07 DIAGNOSIS — J302 Other seasonal allergic rhinitis: Secondary | ICD-10-CM

## 2021-04-07 MED ORDER — LORATADINE 10 MG PO TABS: 10.0000 mg | ORAL_TABLET | Freq: Every day | ORAL | 3 refills | Status: DC

## 2021-04-10 ENCOUNTER — Ambulatory Visit: Payer: Self-pay

## 2021-04-10 NOTE — Telephone Encounter (Signed)
Pt. Reports she started vomiting last Thursday. Had some diarrhea "but that has stopped now." Reports Zofran is only helping a little. Requests another medication for nausea be sent to her pharmacy in [place]. Please advise.    Answer Assessment - Initial Assessment Questions 1. VOMITING SEVERITY: "How many times have you vomited in the past 24 hours?"     - MILD:  1 - 2 times/day    - MODERATE: 3 - 5 times/day, decreased oral intake without significant weight loss or symptoms of dehydration    - SEVERE: 6 or more times/day, vomits everything or nearly everything, with significant weight loss, symptoms of dehydration      4 2. ONSET: "When did the vomiting begin?"      Last Thursday  3. FLUIDS: "What fluids or food have you vomited up today?" "Have you been able to keep any fluids down?"     Small amount 4. ABDOMINAL PAIN: "Are your having any abdominal pain?" If yes : "How bad is it and what does it feel like?" (e.g., crampy, dull, intermittent, constant)      Cramping, nausea 5. DIARRHEA: "Is there any diarrhea?" If Yes, ask: "How many times today?"      Last week 6. CONTACTS: "Is there anyone else in the family with the same symptoms?"      No 7. CAUSE: "What do you think is causing your vomiting?"     Unsure 8. HYDRATION STATUS: "Any signs of dehydration?" (e.g., dry mouth [not only dry lips], too weak to stand) "When did you last urinate?"     No 9. OTHER SYMPTOMS: "Do you have any other symptoms?" (e.g., fever, headache, vertigo, vomiting blood or coffee grounds, recent head injury)     No 10. PREGNANCY: "Is there any chance you are pregnant?" "When was your last menstrual period?"       No  Protocols used: Vomiting-A-AH

## 2021-04-10 NOTE — Telephone Encounter (Signed)
She can take up to 8 mg of Zofran every 8 hours.  If this is not effective.  She can also try Dramamine or Emetrol OTC.  If she needs something stronger, she will at least have to make a virtual visit to discuss.

## 2021-04-11 NOTE — Telephone Encounter (Signed)
The pt was notified of the providers recommendation. She verbalize understanding, no questions or concerns.

## 2021-04-12 ENCOUNTER — Other Ambulatory Visit: Payer: Self-pay | Admitting: Internal Medicine

## 2021-04-12 DIAGNOSIS — E785 Hyperlipidemia, unspecified: Secondary | ICD-10-CM

## 2021-04-12 NOTE — Telephone Encounter (Signed)
Requested Prescriptions  Pending Prescriptions Disp Refills  . atorvastatin (LIPITOR) 20 MG tablet [Pharmacy Med Name: ATORVASTATIN 20 MG TABLET] 90 tablet 2    Sig: TAKE 1 TABLET BY MOUTH EVERY DAY     Cardiovascular:  Antilipid - Statins Passed - 04/12/2021 11:37 AM      Passed - Total Cholesterol in normal range and within 360 days    Cholesterol, Total  Date Value Ref Range Status  12/21/2014 124 100 - 199 mg/dL Final   Cholesterol  Date Value Ref Range Status  04/02/2021 127 <200 mg/dL Final         Passed - LDL in normal range and within 360 days    LDL Cholesterol (Calc)  Date Value Ref Range Status  04/02/2021 57 mg/dL (calc) Final    Comment:    Reference range: <100 . Desirable range <100 mg/dL for primary prevention;   <70 mg/dL for patients with CHD or diabetic patients  with > or = 2 CHD risk factors. Marland Kitchen LDL-C is now calculated using the Martin-Hopkins  calculation, which is a validated novel method providing  better accuracy than the Friedewald equation in the  estimation of LDL-C.  Cresenciano Genre et al. Annamaria Helling. 3491;791(50): 2061-2068  (http://education.[facility].com/faq/FAQ164)          Passed - HDL in normal range and within 360 days    HDL  Date Value Ref Range Status  04/02/2021 53 > OR = 50 mg/dL Final  12/21/2014 40 >39 mg/dL Final    Comment:    According to ATP-III Guidelines, HDL-C >59 mg/dL is considered a negative risk factor for CHD.          Passed - Triglycerides in normal range and within 360 days    Triglycerides  Date Value Ref Range Status  04/02/2021 87 <150 mg/dL Final         Passed - Patient is not pregnant      Passed - Valid encounter within last 12 months    Recent Outpatient Visits          1 week ago Need for immunization against influenza   [facility] [place], Coralie Keens, NP   6 months ago Anxiety associated with depression   [facility] [place], Coralie Keens, NP   11 months ago Essential  hypertension   [place], FNP   1 year ago Annual physical exam   [facility], Lupita Raider, [place]   1 year ago Encounter for screening mammogram for malignant neoplasm of breast   [place], FNP      Future Appointments            In 11 months Baity, Coralie Keens, NP [facility], [facility]

## 2021-04-30 ENCOUNTER — Telehealth: Payer: Self-pay

## 2021-04-30 NOTE — Telephone Encounter (Signed)
Copied from [place] [id number]. Topic: Quick Communication - Rx Refill/Question >> Apr 29, 2021 10:40 AM [place], Brayton Layman A wrote: Pt wanted to know if Webb Silversmith would be willing to send in a few Zofran to [id number] - [place], [place] - [address] to hold her over for thanksgiving weekend, please advise.

## 2021-05-05 NOTE — Telephone Encounter (Signed)
I called the patient and she informed me that she received the Zofran, but it is not helping. She is requesting a referral to GI. I informed the patient that she need to schedule a In person visit with Rollene Fare to discuss her concerns for intermittent persistent vomiting. The patient stated that she can not come in the office because she is currently in [place]. I recommended that the patient be seen at a Urgent Care or Emergency room in [place], [place]. She verbalize understanding.

## 2021-05-05 NOTE — Telephone Encounter (Signed)
I am just now seeing this as I was out of the office. Does she still need some Zofran?

## 2021-05-06 NOTE — Telephone Encounter (Signed)
Agree with advice given

## 2021-06-10 ENCOUNTER — Other Ambulatory Visit: Payer: Self-pay | Admitting: Internal Medicine

## 2021-06-10 DIAGNOSIS — F418 Other specified anxiety disorders: Secondary | ICD-10-CM

## 2021-06-11 NOTE — Telephone Encounter (Signed)
Requested Prescriptions  Pending Prescriptions Disp Refills   citalopram (CELEXA) 20 MG tablet [Pharmacy Med Name: CITALOPRAM HBR 20 MG TABLET] 90 tablet 1    Sig: TAKE 1 TABLET BY MOUTH EVERY DAY     Psychiatry:  Antidepressants - SSRI Passed - 06/10/2021 11:28 AM      Passed - Completed PHQ-2 or PHQ-9 in the last 360 days      Passed - Valid encounter within last 6 months    Recent Outpatient Visits          2 months ago Need for immunization against influenza   [place], Coralie Keens, NP   8 months ago Anxiety associated with depression   [facility] [place], Coralie Keens, NP   1 year ago Essential hypertension   [place], FNP   1 year ago Annual physical exam   [facility], Lupita Raider, [place]   1 year ago Encounter for screening mammogram for malignant neoplasm of breast   [place], FNP      Future Appointments            In 9 months Baity, Coralie Keens, NP [facility], [facility]

## 2021-11-25 ENCOUNTER — Ambulatory Visit
Admission: EM | Admit: 2021-11-25 | Discharge: 2021-11-25 | Disposition: A | Discharge status: Home / Self Care | Payer: Medicare Other

## 2021-11-25 DIAGNOSIS — I129 Hypertensive chronic kidney disease with stage 1 through stage 4 chronic kidney disease, or unspecified chronic kidney disease: Secondary | ICD-10-CM | POA: Diagnosis not present

## 2021-11-25 DIAGNOSIS — R0981 Nasal congestion: Secondary | ICD-10-CM | POA: Diagnosis not present

## 2021-11-25 DIAGNOSIS — K5900 Constipation, unspecified: Secondary | ICD-10-CM | POA: Diagnosis not present

## 2021-11-25 DIAGNOSIS — Z8673 Personal history of transient ischemic attack (TIA), and cerebral infarction without residual deficits: Secondary | ICD-10-CM | POA: Diagnosis not present

## 2021-11-25 DIAGNOSIS — N184 Chronic kidney disease, stage 4 (severe): Secondary | ICD-10-CM | POA: Diagnosis not present

## 2021-11-25 DIAGNOSIS — R6883 Chills (without fever): Secondary | ICD-10-CM | POA: Diagnosis not present

## 2021-11-25 DIAGNOSIS — Z20822 Contact with and (suspected) exposure to covid-19: Secondary | ICD-10-CM | POA: Diagnosis not present

## 2021-11-25 DIAGNOSIS — R112 Nausea with vomiting, unspecified: Secondary | ICD-10-CM | POA: Diagnosis not present

## 2021-11-25 NOTE — ED Triage Notes (Signed)
Patient presents to urgent care for vomiting. Has a hx of chronic kidney disease. Pt states nephrologist instructed patient to go to the ED or UC. Per provider recommendations instructed patient to go the ED for further work-up. Verbalized understanding.

## 2021-11-25 NOTE — ED Triage Notes (Signed)
Onset 2 ago with cold chills and vomiting. Pt reports vomiting 3-4 times today. She has not been able to keep anything on her stomach.

## 2021-12-08 ENCOUNTER — Telehealth: Payer: Self-pay

## 2021-12-08 NOTE — Telephone Encounter (Unsigned)
Copied from [place] [id number]. Topic: Referral - Request for Referral >> Dec 08, 2021  3:05 PM Vanessa Camacho wrote: Reason for CRM: The patient was recently seen in [facility] for gastro concerns   The patient was directed to contact their PCP and request a referral to see a gastroenterologist   The patient would like to for their PCP to review the notes from their recent visit before a referral is created for them   The patient would like to be contacted by a member of clinical staff prior to requesting a referral   The patient has also been notified that their nephrologist is medical leave for the rest of 2023  The patient is being seen by a different provider in their nephrologists office (Dr. Candiss Norse)   Please contact the patient further

## 2021-12-10 NOTE — Telephone Encounter (Signed)
She was seen in the ER.  She has to have an ER follow-up before referral will be placed anyway

## 2021-12-10 NOTE — Telephone Encounter (Signed)
Apt 12/30/2021 at 1:40pm.  Pt advised.   Thanks,   -Mickel Baas

## 2021-12-29 ENCOUNTER — Other Ambulatory Visit: Payer: Self-pay | Admitting: Internal Medicine

## 2021-12-29 DIAGNOSIS — F418 Other specified anxiety disorders: Secondary | ICD-10-CM

## 2021-12-30 ENCOUNTER — Ambulatory Visit (INDEPENDENT_AMBULATORY_CARE_PROVIDER_SITE_OTHER): Payer: Medicare Other | Admitting: Internal Medicine

## 2021-12-30 ENCOUNTER — Encounter: Payer: Self-pay | Admitting: Internal Medicine

## 2021-12-30 VITALS — BP 114/64 | HR 64 | Temp 97.8°F | Wt 173.0 lb

## 2021-12-30 DIAGNOSIS — R112 Nausea with vomiting, unspecified: Secondary | ICD-10-CM

## 2021-12-30 DIAGNOSIS — N184 Chronic kidney disease, stage 4 (severe): Secondary | ICD-10-CM

## 2021-12-30 DIAGNOSIS — K5790 Diverticulosis of intestine, part unspecified, without perforation or abscess without bleeding: Secondary | ICD-10-CM

## 2021-12-30 DIAGNOSIS — R748 Abnormal levels of other serum enzymes: Secondary | ICD-10-CM | POA: Diagnosis not present

## 2021-12-30 DIAGNOSIS — D72829 Elevated white blood cell count, unspecified: Secondary | ICD-10-CM | POA: Diagnosis not present

## 2021-12-30 NOTE — Progress Notes (Signed)
Subjective:    Patient ID: Vanessa Camacho, female    DOB: 11/29/1955, 66 y.o.   MRN: [id number]  HPI  Patient presents to clinic today for ER follow-up.  She presented to the ER 6/25 with complaint of nausea and vomiting.  Her labs showed an elevated white count at 13, stable creatinine of 2.8 and elevated lipase of 71.  CT abdomen/pelvis did not show any identifiable cause of her symptoms.  She was treated with Zofran, discharged and advised to follow-up with her PCP.  Since discharge, she had diarrhea for 2 days but no abdominal pain, nausea or vomiting. She denies recurrent symptoms since that time but reports she gets these symptoms about 1 x every 2-3 months. It typically last for a few days. She does have a history of diverticulosis.  Review of Systems     Past Medical History:  Diagnosis Date   Allergy    Anxiety    Arthritis    Chronic kidney disease    stage 4 renal failure past urologist was in [place]   Depression    Hypertension    Stroke [facility])    Urinary incontinence     Current Outpatient Medications  Medication Sig Dispense Refill   amLODipine (NORVASC) 10 MG tablet TAKE 1 TABLET BY MOUTH EVERY DAY 90 tablet 1   aspirin 81 MG chewable tablet Chew by mouth daily.     atorvastatin (LIPITOR) 20 MG tablet TAKE 1 TABLET BY MOUTH EVERY DAY 90 tablet 3   calcitRIOL (ROCALTROL) 0.25 MCG capsule Take 0.25 mcg by mouth every other day.     citalopram (CELEXA) 20 MG tablet TAKE 1 TABLET BY MOUTH EVERY DAY 90 tablet 1   hydrOXYzine (ATARAX/VISTARIL) 10 MG tablet Take 10 mg by mouth 3 (three) times daily as needed.     loratadine (CLARITIN) 10 MG tablet Take 1 tablet (10 mg total) by mouth daily. 90 tablet 3   metoprolol succinate (TOPROL-XL) 25 MG 24 hr tablet TAKE 1 TABLET BY MOUTH EVERY DAY 90 tablet 0   ondansetron (ZOFRAN) 4 MG tablet Take 1 tablet (4 mg total) by mouth every 8 (eight) hours as needed for nausea or vomiting. 20 tablet 0   No current facility-administered  medications for this visit.    No Known Allergies  Family History  Problem Relation Age of Onset   Stroke Mother    Dementia Mother    Hypertension Mother    Cancer Father    Brain cancer Father    Skin cancer Sister     Social History   Socioeconomic History   Marital status: Married    Spouse name: Not on file   Number of children: Not on file   Years of education: Not on file   Highest education level: Not on file  Occupational History   Not on file  Tobacco Use   Smoking status: Every Day    Packs/day: 0.25    Years: 40.00    Total pack years: 10.00    Types: Cigarettes    Last attempt to quit: 01/01/2015    Years since quitting: 7.0   Smokeless tobacco: Never  Vaping Use   Vaping Use: Never used  Substance and Sexual Activity   Alcohol use: Not Currently    Alcohol/week: 0.0 standard drinks of alcohol    Comment: occassional   Drug use: Yes    Frequency: 3.0 times per week    Types: Marijuana    Comment: enjoyment  Sexual activity: Not Currently  Other Topics Concern   Not on file  Social History Narrative   Not on file   Social Determinants of Health   Financial Resource Strain: Low Risk  (01/16/2020)   Overall Financial Resource Strain (CARDIA)    Difficulty of Paying Living Expenses: Not hard at all  Food Insecurity: No Food Insecurity (01/16/2020)   Hunger Vital Sign    Worried About Running Out of Food in the Last Year: Never true    Ran Out of Food in the Last Year: Never true  Transportation Needs: No Transportation Needs (01/16/2020)   PRAPARE - Hydrologist (Medical): No    Lack of Transportation (Non-Medical): No  Physical Activity: Inactive (01/16/2020)   Exercise Vital Sign    Days of Exercise per Week: 0 days    Minutes of Exercise per Session: 0 min  Stress: No Stress Concern Present (01/16/2020)   [place]    Feeling of Stress : Not at all   Social Connections: Not on file  Intimate Partner Violence: Not on file     Constitutional: Denies fever, malaise, fatigue, headache or abrupt weight changes.  Respiratory: Denies difficulty breathing, shortness of breath, cough or sputum production.   Cardiovascular: Denies chest pain, chest tightness, palpitations or swelling in the hands or feet.  Gastrointestinal: Pt reports intermittent abdominal pain, nausea and vomiting. Denies bloating, constipation, diarrhea or blood in the stool.  GU: Denies urgency, frequency, pain with urination, burning sensation, blood in urine, odor or discharge.  No other specific complaints in a complete review of systems (except as listed in HPI above).  Objective:   Physical Exam  BP 114/64 (BP Location: Left Arm, Patient Position: Sitting, Cuff Size: Normal)   Pulse 64   Temp 97.8 F (36.6 C) (Temporal)   Wt 173 lb (78.5 kg)   SpO2 95%   BMI 27.50 kg/m   Wt Readings from Last 3 Encounters:  04/02/21 170 lb 3.2 oz (77.2 kg)  09/30/20 174 lb 3.2 oz (79 kg)  05/06/20 182 lb 3.2 oz (82.6 kg)    General: Appears her stated age, overweight, in NAD. Cardiovascular: Normal rate and rhythm. S1,S2 noted.  No murmur, rubs or gallops noted. Pulmonary/Chest: Normal effort and positive vesicular breath sounds. No respiratory distress. No wheezes, rales or ronchi noted.  Abdomen: Soft and nontender. Normal bowel sounds. No distention or masses noted.  Musculoskeletal:  No difficulty with gait.  Neurological: Alert and oriented.   BMET    Component Value Date/Time   NA 139 01/25/2020 0941   NA 144 12/21/2014 0907   NA 143 04/11/2014 1344   K 4.3 01/25/2020 0941   K 4.8 04/11/2014 1344   CL 107 01/25/2020 0941   CL 108 (H) 04/11/2014 1344   CO2 24 01/25/2020 0941   CO2 27 04/11/2014 1344   GLUCOSE 110 (H) 01/25/2020 0941   GLUCOSE 132 (H) 04/11/2014 1344   BUN 31 (H) 01/25/2020 0941   BUN 30 (H) 12/21/2014 0907   BUN 38 (H) 04/11/2014 1344    CREATININE 2.55 (H) 01/25/2020 0941   CALCIUM 9.2 01/25/2020 0941   CALCIUM 8.7 04/11/2014 1344   GFRNONAA 19 (L) 01/25/2020 0941   GFRAA 22 (L) 01/25/2020 0941    Lipid Panel     Component Value Date/Time   CHOL 127 04/02/2021 0846   CHOL 124 12/21/2014 0907   TRIG 87 04/02/2021 0846  HDL 53 04/02/2021 0846   HDL 40 12/21/2014 0907   CHOLHDL 2.4 04/02/2021 0846   LDLCALC 57 04/02/2021 0846    CBC    Component Value Date/Time   WBC 7.8 01/25/2020 0941   RBC 4.63 01/25/2020 0941   HGB 13.9 01/25/2020 0941   HCT 43.5 01/25/2020 0941   PLT 134 (L) 01/25/2020 0941   MCV 94.0 01/25/2020 0941   MCH 30.0 01/25/2020 0941   MCHC 32.0 01/25/2020 0941   RDW 12.0 01/25/2020 0941   LYMPHSABS 1,997 01/25/2020 0941   MONOABS 0.7 08/03/2019 1049   EOSABS 179 01/25/2020 0941   BASOSABS 31 01/25/2020 0941    Hgb A1C Lab Results  Component Value Date   HGBA1C 5.4 01/25/2020            Assessment & Plan:   ER follow-up for Nausea and Vomiting, Leukocytosis, CKD, Elevated Lipase, Diverticulosis:  ER notes, labs and imaging reviewed I feel like her symptoms are likely coming from diverticulosis We will repeat CBC, c-Met, lipase and obtain a food allergy profile today Consider referral to GI for further evaluation with a colonoscopy She has Zofran on hand if needed Discussed diet changes for people with diverticulosis  Please schedule an appointment for follow-up of chronic conditions Webb Silversmith, NP

## 2021-12-30 NOTE — Telephone Encounter (Signed)
Pt has future visit schedule, will refill medication until OV.  Requested Prescriptions  Pending Prescriptions Disp Refills  . citalopram (CELEXA) 20 MG tablet [Pharmacy Med Name: CITALOPRAM HBR 20 MG TABLET] 90 tablet 1    Sig: TAKE 1 TABLET BY MOUTH EVERY DAY     Psychiatry:  Antidepressants - SSRI Failed - 12/29/2021  2:22 AM      Failed - Valid encounter within last 6 months    Recent Outpatient Visits          Today Nausea and vomiting, unspecified vomiting type   [place], NP   9 months ago Need for immunization against influenza   [facility] [address] [place], Coralie Keens, NP   1 year ago Anxiety associated with depression   [facility] [place], Coralie Keens, NP   1 year ago Essential hypertension   [place], FNP   1 year ago Annual physical exam   [facility], Lupita Raider, FNP      Future Appointments            In 3 months Baity, Coralie Keens, NP [place] - Completed PHQ-2 or PHQ-9 in the last 360 days

## 2021-12-30 NOTE — Patient Instructions (Signed)

## 2021-12-31 LAB — COMPLETE METABOLIC PANEL WITHOUT GFR
AG Ratio: 1.6 (calc) (ref 1.0–2.5)
ALT: 8 U/L (ref 6–29)
AST: 13 U/L (ref 10–35)
Albumin: 4.2 g/dL (ref 3.6–5.1)
Alkaline phosphatase (APISO): 81 U/L (ref 37–153)
BUN/Creatinine Ratio: 15 (calc) (ref 6–22)
BUN: 49 mg/dL — ABNORMAL HIGH (ref 7–25)
CO2: 25 mmol/L (ref 20–32)
Calcium: 9.6 mg/dL (ref 8.6–10.4)
Chloride: 108 mmol/L (ref 98–110)
Creat: 3.24 mg/dL — ABNORMAL HIGH (ref 0.50–1.05)
Globulin: 2.7 g/dL (ref 1.9–3.7)
Glucose, Bld: 92 mg/dL (ref 65–139)
Potassium: 5.1 mmol/L (ref 3.5–5.3)
Sodium: 140 mmol/L (ref 135–146)
Total Bilirubin: 0.6 mg/dL (ref 0.2–1.2)
Total Protein: 6.9 g/dL (ref 6.1–8.1)
eGFR: 15 mL/min/1.73m2 — ABNORMAL LOW

## 2021-12-31 LAB — FOOD ALLERGY PROFILE
"Sesame Seed f10 ": <0.1 kU/L
Allergen, Salmon, f41: <0.1 kU/L
Almonds: <0.1 kU/L
CLASS: 0
CLASS: 0
CLASS: 0
CLASS: 0
CLASS: 0
CLASS: 0
CLASS: 0
CLASS: 0
CLASS: 0
CLASS: 0
CLASS: 0
Cashew IgE: <0.1 kU/L
Class: 0
Class: 0
Class: 0
Class: 0
Egg White IgE: <0.1 kU/L
Fish Cod: <0.1 kU/L
Hazelnut: <0.1 kU/L
Milk IgE: <0.1 kU/L
Peanut IgE: <0.1 kU/L
Scallop IgE: <0.1 kU/L
Shrimp IgE: <0.1 kU/L
Soybean IgE: <0.1 kU/L
Tuna IgE: <0.1 kU/L
Walnut: <0.1 kU/L
Wheat IgE: <0.1 kU/L

## 2021-12-31 LAB — CBC
HCT: 43.5 % (ref 35.0–45.0)
Hemoglobin: 14.4 g/dL (ref 11.7–15.5)
MCH: 30.7 pg (ref 27.0–33.0)
MCHC: 33.1 g/dL (ref 32.0–36.0)
MCV: 92.8 fL (ref 80.0–100.0)
MPV: 12.4 fL (ref 7.5–12.5)
Platelets: 168 Thousand/uL (ref 140–400)
RBC: 4.69 Million/uL (ref 3.80–5.10)
RDW: 12.1 % (ref 11.0–15.0)
WBC: 8.5 Thousand/uL (ref 3.8–10.8)

## 2021-12-31 LAB — LIPASE: Lipase: 73 U/L — ABNORMAL HIGH (ref 7–60)

## 2021-12-31 LAB — INTERPRETATION:

## 2022-02-02 ENCOUNTER — Encounter: Payer: Self-pay | Admitting: Internal Medicine

## 2022-02-02 ENCOUNTER — Ambulatory Visit (INDEPENDENT_AMBULATORY_CARE_PROVIDER_SITE_OTHER): Payer: Medicare Other | Admitting: Internal Medicine

## 2022-02-02 VITALS — BP 122/76 | HR 73 | Temp 96.6°F | Wt 171.0 lb

## 2022-02-02 DIAGNOSIS — Z6827 Body mass index (BMI) 27.0-27.9, adult: Secondary | ICD-10-CM

## 2022-02-02 DIAGNOSIS — E7800 Pure hypercholesterolemia, unspecified: Secondary | ICD-10-CM

## 2022-02-02 DIAGNOSIS — E6609 Other obesity due to excess calories: Secondary | ICD-10-CM | POA: Insufficient documentation

## 2022-02-02 DIAGNOSIS — I6930 Unspecified sequelae of cerebral infarction: Secondary | ICD-10-CM | POA: Diagnosis not present

## 2022-02-02 DIAGNOSIS — N2581 Secondary hyperparathyroidism of renal origin: Secondary | ICD-10-CM | POA: Diagnosis not present

## 2022-02-02 DIAGNOSIS — I10 Essential (primary) hypertension: Secondary | ICD-10-CM | POA: Diagnosis not present

## 2022-02-02 DIAGNOSIS — N184 Chronic kidney disease, stage 4 (severe): Secondary | ICD-10-CM

## 2022-02-02 DIAGNOSIS — D692 Other nonthrombocytopenic purpura: Secondary | ICD-10-CM | POA: Diagnosis not present

## 2022-02-02 DIAGNOSIS — E663 Overweight: Secondary | ICD-10-CM | POA: Insufficient documentation

## 2022-02-02 DIAGNOSIS — F418 Other specified anxiety disorders: Secondary | ICD-10-CM

## 2022-02-02 NOTE — Progress Notes (Signed)
Subjective:    Patient ID: Vanessa Camacho, female    DOB: Nov 05, 1955, 66 y.o.   MRN: [id number]  HPI  Patient presents to clinic today for follow-up of chronic conditions.  Anxiety and Depression: Chronic, managed with Citalopram and Hydroxyzine.  She is not currently seeing a therapist.  She denies SI/HI.  HTN: Her BP today is 122/76.  She is taking Amlodipine and Metoprolol as prescribed.  There is no ECG on file.  HLD status post Stroke: No residual effect.  Her last LDL was 57, triglycerides 87, 03/2021.  She denies myalgias on Atorvastatin.  She is taking Metoprolol and Aspirin as prescribed.  She tries to consume a low-fat diet.  She does not follow with neurology.  CKD 4: Her last creatinine was 2.8, GFR 18, 11/2021.  She is not currently on an ACEI/ARB.  She follows with nephrology.  Secondary Hyperparathyroidism: Secondary to CKD.  She is taking Rocaltrol as prescribed.  Review of Systems     Past Medical History:  Diagnosis Date   Allergy    Anxiety    Arthritis    Chronic kidney disease    stage 4 renal failure past urologist was in [place]   Depression    Hypertension    Stroke [facility])    Urinary incontinence     Current Outpatient Medications  Medication Sig Dispense Refill   amLODipine (NORVASC) 10 MG tablet TAKE 1 TABLET BY MOUTH EVERY DAY 90 tablet 1   aspirin 81 MG chewable tablet Chew by mouth daily.     atorvastatin (LIPITOR) 20 MG tablet TAKE 1 TABLET BY MOUTH EVERY DAY 90 tablet 3   calcitRIOL (ROCALTROL) 0.25 MCG capsule Take 0.25 mcg by mouth every other day.     citalopram (CELEXA) 20 MG tablet TAKE 1 TABLET BY MOUTH EVERY DAY 90 tablet 1   hydrOXYzine (ATARAX/VISTARIL) 10 MG tablet Take 10 mg by mouth 3 (three) times daily as needed.     loratadine (CLARITIN) 10 MG tablet Take 1 tablet (10 mg total) by mouth daily. 90 tablet 3   metoprolol succinate (TOPROL-XL) 25 MG 24 hr tablet TAKE 1 TABLET BY MOUTH EVERY DAY 90 tablet 0   ondansetron (ZOFRAN) 4  MG tablet Take 1 tablet (4 mg total) by mouth every 8 (eight) hours as needed for nausea or vomiting. 20 tablet 0   No current facility-administered medications for this visit.    No Known Allergies  Family History  Problem Relation Age of Onset   Stroke Mother    Dementia Mother    Hypertension Mother    Cancer Father    Brain cancer Father    Skin cancer Sister     Social History   Socioeconomic History   Marital status: Married    Spouse name: Not on file   Number of children: Not on file   Years of education: Not on file   Highest education level: Not on file  Occupational History   Not on file  Tobacco Use   Smoking status: Every Day    Packs/day: 0.25    Years: 40.00    Total pack years: 10.00    Types: Cigarettes    Last attempt to quit: 01/01/2015    Years since quitting: 7.0   Smokeless tobacco: Never  Vaping Use   Vaping Use: Never used  Substance and Sexual Activity   Alcohol use: Not Currently    Alcohol/week: 0.0 standard drinks of alcohol    Comment: occassional  Drug use: Yes    Frequency: 3.0 times per week    Types: Marijuana    Comment: enjoyment   Sexual activity: Not Currently  Other Topics Concern   Not on file  Social History Narrative   Not on file   Social Determinants of Health   Financial Resource Strain: Low Risk  (01/16/2020)   Overall Financial Resource Strain (CARDIA)    Difficulty of Paying Living Expenses: Not hard at all  Food Insecurity: No Food Insecurity (01/16/2020)   Hunger Vital Sign    Worried About Running Out of Food in the Last Year: Never true    Ran Out of Food in the Last Year: Never true  Transportation Needs: No Transportation Needs (01/16/2020)   PRAPARE - Hydrologist (Medical): No    Lack of Transportation (Non-Medical): No  Physical Activity: Inactive (01/16/2020)   Exercise Vital Sign    Days of Exercise per Week: 0 days    Minutes of Exercise per Session: 0 min  Stress:  No Stress Concern Present (01/16/2020)   [place]    Feeling of Stress : Not at all  Social Connections: Not on file  Intimate Partner Violence: Not on file     Constitutional: Denies fever, malaise, fatigue, headache or abrupt weight changes.  HEENT: Denies eye pain, eye redness, ear pain, ringing in the ears, wax buildup, runny nose, nasal congestion, bloody nose, or sore throat. Respiratory: Denies difficulty breathing, shortness of breath, cough or sputum production.   Cardiovascular: Denies chest pain, chest tightness, palpitations or swelling in the hands or feet.  Gastrointestinal: Denies abdominal pain, bloating, constipation, diarrhea or blood in the stool.  GU: Denies urgency, frequency, pain with urination, burning sensation, blood in urine, odor or discharge. Musculoskeletal: Denies decrease in range of motion, difficulty with gait, muscle pain or joint pain and swelling.  Skin: Denies redness, rashes, lesions or ulcercations.  Neurological: Denies dizziness, difficulty with memory, difficulty with speech or problems with balance and coordination.  Psych: Pt has a history of anxiety and depression. Denies SI/HI.  No other specific complaints in a complete review of systems (except as listed in HPI above).  Objective:   Physical Exam  BP 122/76 (BP Location: Left Arm, Patient Position: Sitting, Cuff Size: Normal)   Pulse 73   Temp (!) 96.6 F (35.9 C) (Temporal)   Wt 171 lb (77.6 kg)   SpO2 98%   BMI 27.19 kg/m   Wt Readings from Last 3 Encounters:  12/30/21 173 lb (78.5 kg)  04/02/21 170 lb 3.2 oz (77.2 kg)  09/30/20 174 lb 3.2 oz (79 kg)    General: Appears her stated age, overweight, in NAD. Skin: Warm, dry and intact. Purpura noted of upper extremities. HEENT: Head: normal shape and size; Eyes: sclera white, no icterus, conjunctiva pink, PERRLA and EOMs intact;  Cardiovascular: Normal rate and  rhythm. S1,S2 noted.  No murmur, rubs or gallops noted. No JVD or BLE edema. No carotid bruits noted. Pulmonary/Chest: Normal effort and positive vesicular breath sounds. No respiratory distress. No wheezes, rales or ronchi noted.  Musculoskeletal: Kyphotic. No difficulty with gait.  Neurological: Alert and oriented. Psychiatric: Mood and affect normal. Behavior is normal. Judgment and thought content normal.    BMET    Component Value Date/Time   NA 140 12/30/2021 1344   NA 144 12/21/2014 0907   NA 143 04/11/2014 1344   K 5.1 12/30/2021 1344  K 4.8 04/11/2014 1344   CL 108 12/30/2021 1344   CL 108 (H) 04/11/2014 1344   CO2 25 12/30/2021 1344   CO2 27 04/11/2014 1344   GLUCOSE 92 12/30/2021 1344   GLUCOSE 132 (H) 04/11/2014 1344   BUN 49 (H) 12/30/2021 1344   BUN 30 (H) 12/21/2014 0907   BUN 38 (H) 04/11/2014 1344   CREATININE 3.24 (H) 12/30/2021 1344   CALCIUM 9.6 12/30/2021 1344   CALCIUM 8.7 04/11/2014 1344   GFRNONAA 19 (L) 01/25/2020 0941   GFRAA 22 (L) 01/25/2020 0941    Lipid Panel     Component Value Date/Time   CHOL 127 04/02/2021 0846   CHOL 124 12/21/2014 0907   TRIG 87 04/02/2021 0846   HDL 53 04/02/2021 0846   HDL 40 12/21/2014 0907   CHOLHDL 2.4 04/02/2021 0846   LDLCALC 57 04/02/2021 0846    CBC    Component Value Date/Time   WBC 8.5 12/30/2021 1344   RBC 4.69 12/30/2021 1344   HGB 14.4 12/30/2021 1344   HCT 43.5 12/30/2021 1344   PLT 168 12/30/2021 1344   MCV 92.8 12/30/2021 1344   MCH 30.7 12/30/2021 1344   MCHC 33.1 12/30/2021 1344   RDW 12.1 12/30/2021 1344   LYMPHSABS 1,997 01/25/2020 0941   MONOABS 0.7 08/03/2019 1049   EOSABS 179 01/25/2020 0941   BASOSABS 31 01/25/2020 0941    Hgb A1C Lab Results  Component Value Date   HGBA1C 5.4 01/25/2020           Assessment & Plan:     RTC in 6 months, for your annual exam Webb Silversmith, NP

## 2022-02-02 NOTE — Assessment & Plan Note (Signed)
Encourage diet and exercise for weight loss 

## 2022-02-02 NOTE — Assessment & Plan Note (Signed)
C-Met today She continue to follow with nephrology

## 2022-02-02 NOTE — Assessment & Plan Note (Signed)
Continue atorvastatin, metoprolol and aspirin C-Met and lipid profile today Encouraged her to consume a low-fat diet

## 2022-02-02 NOTE — Assessment & Plan Note (Signed)
Controlled on amlodipine and metoprolol Reinforced DASH diet and exercise for weight loss C-Met today

## 2022-02-02 NOTE — Assessment & Plan Note (Signed)
Continue rolocatrol She will continue to follow with nephrology

## 2022-02-02 NOTE — Assessment & Plan Note (Signed)
We will monitor 

## 2022-02-02 NOTE — Assessment & Plan Note (Signed)
Stable on her current dose of citalopram and hydralazine Support offered

## 2022-02-02 NOTE — Patient Instructions (Signed)

## 2022-02-02 NOTE — Assessment & Plan Note (Signed)
Encouraged her to consume low-fat diet Continue simvastatin C-Met and lipid profile today

## 2022-02-03 LAB — COMPLETE METABOLIC PANEL WITH GFR
AG Ratio: 1.7 (calc) (ref 1.0–2.5)
ALT: 7 U/L (ref 6–29)
AST: 11 U/L (ref 10–35)
Albumin: 4.3 g/dL (ref 3.6–5.1)
Alkaline phosphatase (APISO): 86 U/L (ref 37–153)
BUN/Creatinine Ratio: 14 (calc) (ref 6–22)
BUN: 49 mg/dL — ABNORMAL HIGH (ref 7–25)
CO2: 23 mmol/L (ref 20–32)
Calcium: 9.2 mg/dL (ref 8.6–10.4)
Chloride: 105 mmol/L (ref 98–110)
Creat: 3.57 mg/dL — ABNORMAL HIGH (ref 0.50–1.05)
Globulin: 2.6 g/dL (calc) (ref 1.9–3.7)
Glucose, Bld: 113 mg/dL — ABNORMAL HIGH (ref 65–99)
Potassium: 4 mmol/L (ref 3.5–5.3)
Sodium: 140 mmol/L (ref 135–146)
Total Bilirubin: 0.5 mg/dL (ref 0.2–1.2)
Total Protein: 6.9 g/dL (ref 6.1–8.1)
eGFR: 13 mL/min/{1.73_m2} — ABNORMAL LOW (ref >=60)

## 2022-02-03 LAB — LIPID PANEL
Cholesterol: 133 mg/dL (ref <200)
HDL: 47 mg/dL — ABNORMAL LOW (ref >=50)
LDL Cholesterol (Calc): 69 mg/dL (calc)
Non-HDL Cholesterol (Calc): 86 mg/dL (calc) (ref <130)
Total CHOL/HDL Ratio: 2.8 (calc) (ref <5.0)
Triglycerides: 83 mg/dL (ref <150)

## 2022-02-16 DIAGNOSIS — R7989 Other specified abnormal findings of blood chemistry: Secondary | ICD-10-CM | POA: Diagnosis not present

## 2022-02-16 DIAGNOSIS — R1013 Epigastric pain: Secondary | ICD-10-CM | POA: Diagnosis not present

## 2022-02-16 DIAGNOSIS — R112 Nausea with vomiting, unspecified: Secondary | ICD-10-CM | POA: Diagnosis not present

## 2022-02-16 DIAGNOSIS — R197 Diarrhea, unspecified: Secondary | ICD-10-CM | POA: Diagnosis not present

## 2022-02-16 DIAGNOSIS — Z20822 Contact with and (suspected) exposure to covid-19: Secondary | ICD-10-CM | POA: Diagnosis not present

## 2022-02-16 DIAGNOSIS — Z9049 Acquired absence of other specified parts of digestive tract: Secondary | ICD-10-CM | POA: Diagnosis not present

## 2022-02-17 ENCOUNTER — Telehealth: Payer: Self-pay | Admitting: Internal Medicine

## 2022-02-17 NOTE — Telephone Encounter (Signed)
Yes, she needs to schedule an ER follow-up with me

## 2022-02-17 NOTE — Telephone Encounter (Signed)
pt is in [place] right now and went to the ED last night.  For 3 days she was vomiting and urinating everywhere.  She would like Rollene Fare look at the summary from the hospital. The dr added 3 new meds.  The dr thinks she may need a referral to a GI dr.  But good news was her blood count looked better than it did a few weeks ago.  Pt is feeling better. Pt will be back in a week or 2 if you want to see her.  She said just let her know.

## 2022-02-18 NOTE — Telephone Encounter (Signed)
Pt advised.  She will call back and schedule an ER follow up in a few days.   Thanks,   -Mickel Baas

## 2022-04-03 ENCOUNTER — Other Ambulatory Visit: Payer: Self-pay | Admitting: Internal Medicine

## 2022-04-03 DIAGNOSIS — E785 Hyperlipidemia, unspecified: Secondary | ICD-10-CM

## 2022-04-03 NOTE — Telephone Encounter (Signed)
Requested Prescriptions  Pending Prescriptions Disp Refills  . atorvastatin (LIPITOR) 20 MG tablet [Pharmacy Med Name: ATORVASTATIN 20 MG TABLET] 90 tablet 1    Sig: TAKE 1 TABLET BY MOUTH EVERY DAY     Cardiovascular:  Antilipid - Statins Failed - 04/03/2022 12:00 PM      Failed - Lipid Panel in normal range within the last 12 months    Cholesterol, Total  Date Value Ref Range Status  12/21/2014 124 100 - 199 mg/dL Final   Cholesterol  Date Value Ref Range Status  02/02/2022 133 <200 mg/dL Final   LDL Cholesterol (Calc)  Date Value Ref Range Status  02/02/2022 69 mg/dL (calc) Final    Comment:    Reference range: <100 . Desirable range <100 mg/dL for primary prevention;   <70 mg/dL for patients with CHD or diabetic patients  with > or = 2 CHD risk factors. Marland Kitchen LDL-C is now calculated using the Martin-Hopkins  calculation, which is a validated novel method providing  better accuracy than the Friedewald equation in the  estimation of LDL-C.  Cresenciano Genre et al. Annamaria Helling. 3220;254(27): 2061-2068  (http://education.[facility].com/faq/FAQ164)    HDL  Date Value Ref Range Status  02/02/2022 47 (L) > OR = 50 mg/dL Final  12/21/2014 40 >39 mg/dL Final    Comment:    According to ATP-III Guidelines, HDL-C >59 mg/dL is considered a negative risk factor for CHD.    Triglycerides  Date Value Ref Range Status  02/02/2022 83 <150 mg/dL Final         Passed - Patient is not pregnant      Passed - Valid encounter within last 12 months    Recent Outpatient Visits          2 months ago Pure hypercholesterolemia   [place], [place], NP   3 months ago Nausea and vomiting, unspecified vomiting type   [facility] [place], Coralie Keens, NP   1 year ago Need for immunization against influenza   [facility] [place], Coralie Keens, NP   1 year ago Anxiety associated with depression   [facility] [place], Coralie Keens, NP   1  year ago Essential hypertension   [facility] [place], Lupita Raider, FNP      Future Appointments            In 4 months Baity, Coralie Keens, NP [facility], [facility]

## 2022-04-06 ENCOUNTER — Encounter: Payer: Medicare Other | Admitting: Internal Medicine

## 2022-04-23 ENCOUNTER — Other Ambulatory Visit: Payer: Self-pay | Admitting: Internal Medicine

## 2022-04-23 DIAGNOSIS — J302 Other seasonal allergic rhinitis: Secondary | ICD-10-CM

## 2022-04-23 NOTE — Telephone Encounter (Signed)
Requested Prescriptions  Pending Prescriptions Disp Refills   loratadine (CLARITIN) 10 MG tablet [Pharmacy Med Name: LORATADINE 10 MG TABLET] 90 tablet 3    Sig: TAKE 1 TABLET BY MOUTH EVERY DAY     Ear, Nose, and Throat:  Antihistamines 2 Failed - 04/23/2022  2:24 AM      Failed - Cr in normal range and within 360 days    Creat  Date Value Ref Range Status  02/02/2022 3.57 (H) 0.50 - 1.05 mg/dL Final         Passed - Valid encounter within last 12 months    Recent Outpatient Visits           2 months ago Pure hypercholesterolemia   [place], [place], NP   3 months ago Nausea and vomiting, unspecified vomiting type   [facility] [place], Coralie Keens, NP   1 year ago Need for immunization against influenza   [facility] [place], Coralie Keens, NP   1 year ago Anxiety associated with depression   [facility] [place], Coralie Keens, NP   1 year ago Essential hypertension   [facility], Lupita Raider, FNP       Future Appointments             In 3 months Baity, Coralie Keens, NP [facility], [facility]

## 2022-07-06 ENCOUNTER — Other Ambulatory Visit: Payer: Self-pay | Admitting: Internal Medicine

## 2022-07-06 DIAGNOSIS — F418 Other specified anxiety disorders: Secondary | ICD-10-CM

## 2022-07-06 NOTE — Telephone Encounter (Signed)
Requested Prescriptions  Pending Prescriptions Disp Refills   citalopram (CELEXA) 20 MG tablet [Pharmacy Med Name: CITALOPRAM HBR 20 MG TABLET] 90 tablet 1    Sig: TAKE 1 TABLET BY MOUTH EVERY DAY     Psychiatry:  Antidepressants - SSRI Passed - 07/06/2022  1:42 AM      Passed - Completed PHQ-2 or PHQ-9 in the last 360 days      Passed - Valid encounter within last 6 months    Recent Outpatient Visits           5 months ago Pure hypercholesterolemia   [facility] [place], [place] W, NP   6 months ago Nausea and vomiting, unspecified vomiting type   [facility] [place], Coralie Keens, NP   1 year ago Need for immunization against influenza   [facility] [place], Coralie Keens, NP   1 year ago Anxiety associated with depression   [facility] [place], Coralie Keens, NP   2 years ago Essential hypertension   [place], FNP       Future Appointments             In 1 month Baity, Coralie Keens, NP [facility], [facility]

## 2022-07-28 ENCOUNTER — Ambulatory Visit: Payer: Self-pay | Admitting: *Deleted

## 2022-07-28 NOTE — Telephone Encounter (Signed)
Summary: Vomiting   Patient states that she has been vomiting since Sunday and is unable to keep liquids or solids down. Patient is requesting Zofran. Patient refused appointment.       Reason for Disposition . [1] MODERATE vomiting (e.g., 3 - 5 times/day) AND [2] age > 60 years  Answer Assessment - Initial Assessment Questions 1. VOMITING SEVERITY: "How many times have you vomited in the past 24 hours?"     - MILD:  1 - 2 times/day    - MODERATE: 3 - 5 times/day, decreased oral intake without significant weight loss or symptoms of dehydration    - SEVERE: 6 or more times/day, vomits everything or nearly everything, with significant weight loss, symptoms of dehydration      severe 2. ONSET: "When did the vomiting begin?"      Saturday night 3. FLUIDS: "What fluids or food have you vomited up today?" "Have you been able to keep any fluids down?"     Can not keep fluid down 4. ABDOMEN PAIN: "Are your having any abdomen pain?" If Yes : "How bad is it and what does it feel like?" (e.g., crampy, dull, intermittent, constant)      no 5. DIARRHEA: "Is there any diarrhea?" If Yes, ask: "How many times today?"      no 6. CONTACTS: "Is there anyone else in the family with the same symptoms?"      no 7. CAUSE: "What do you think is causing your vomiting?"     Patient made a pot of chili  8. HYDRATION STATUS: "Any signs of dehydration?" (e.g., dry mouth [not only dry lips], too weak to stand) "When did you last urinate?"     No- 1 hour ago 9. OTHER SYMPTOMS: "Do you have any other symptoms?" (e.g., fever, headache, vertigo, vomiting blood or coffee grounds, recent head injury)     Ear ache, cramps at ribs- comes and goes  Protocols used: Vomiting-A-AH

## 2022-07-28 NOTE — Telephone Encounter (Signed)
  Chief Complaint: vomiting- requesting medication  Symptoms: moderate/severe vomiting- OTC pepto not helping, unable to keep fluids/solids down Frequency: started Saturday night Pertinent Negatives: Patient denies fever, headache, vertigo, vomiting blood or coffee grounds, recent head injury Disposition: [x]$ ED /[]$ Urgent Care (no appt availability in office) / []$ Appointment(In office/virtual)/ []$  [facility]/ []$ Home Care/ []$ Refused Recommended Disposition /[]$ [place] [facility]/ []$  Follow-up with PCP Additional Notes: Patient is requesting medication to calm the vomiting- patient is currently out of town in [place]. Patient states the vomiting started after she made chili. Patient advised UC- but she states they will not see her due to her kidney disease and she does not want to go to ED. Patient is requesting medication- Zofran- [facility] [place], [place]

## 2022-07-28 NOTE — Telephone Encounter (Signed)
If she is unable to keep anything down, I am not comfortable sending in medication.  She can try Emetrol OTC otherwise would recommend urgent care

## 2022-07-28 NOTE — Telephone Encounter (Signed)
Pt advised.   Thanks,   -Gabor Lusk  

## 2022-08-05 ENCOUNTER — Ambulatory Visit (INDEPENDENT_AMBULATORY_CARE_PROVIDER_SITE_OTHER): Payer: Medicare Other | Admitting: Internal Medicine

## 2022-08-05 ENCOUNTER — Encounter: Payer: Self-pay | Admitting: Internal Medicine

## 2022-08-05 VITALS — BP 126/74 | HR 77 | Temp 96.8°F | Wt 183.0 lb

## 2022-08-05 DIAGNOSIS — Z1231 Encounter for screening mammogram for malignant neoplasm of breast: Secondary | ICD-10-CM

## 2022-08-05 DIAGNOSIS — Z0001 Encounter for general adult medical examination with abnormal findings: Secondary | ICD-10-CM | POA: Diagnosis not present

## 2022-08-05 DIAGNOSIS — R7309 Other abnormal glucose: Secondary | ICD-10-CM

## 2022-08-05 DIAGNOSIS — E2839 Other primary ovarian failure: Secondary | ICD-10-CM | POA: Diagnosis not present

## 2022-08-05 DIAGNOSIS — E7800 Pure hypercholesterolemia, unspecified: Secondary | ICD-10-CM | POA: Diagnosis not present

## 2022-08-05 DIAGNOSIS — Z1211 Encounter for screening for malignant neoplasm of colon: Secondary | ICD-10-CM

## 2022-08-05 DIAGNOSIS — M810 Age-related osteoporosis without current pathological fracture: Secondary | ICD-10-CM

## 2022-08-05 DIAGNOSIS — Z6829 Body mass index (BMI) 29.0-29.9, adult: Secondary | ICD-10-CM

## 2022-08-05 DIAGNOSIS — E663 Overweight: Secondary | ICD-10-CM

## 2022-08-05 MED ORDER — ONDANSETRON 8 MG PO TBDP: 8.0000 mg | ORAL_TABLET | Freq: Three times a day (TID) | ORAL | 0 refills | Status: AC | PRN

## 2022-08-05 MED ORDER — OMEPRAZOLE 20 MG PO CPDR: 20.0000 mg | DELAYED_RELEASE_CAPSULE | Freq: Every day | ORAL | 1 refills | Status: DC

## 2022-08-05 NOTE — Patient Instructions (Signed)
Health Maintenance for Postmenopausal Women Menopause is a normal process in which your ability to get pregnant comes to an end. This process happens slowly over many months or years, usually between the ages of 48 and 55. Menopause is complete when you have missed your menstrual period for 12 months. It is important to talk with your health care provider about some of the most common conditions that affect women after menopause (postmenopausal women). These include heart disease, cancer, and bone loss (osteoporosis). Adopting a healthy lifestyle and getting preventive care can help to promote your health and wellness. The actions you take can also lower your chances of developing some of these common conditions. What are the signs and symptoms of menopause? During menopause, you may have the following symptoms: Hot flashes. These can be moderate or severe. Night sweats. Decrease in sex drive. Mood swings. Headaches. Tiredness (fatigue). Irritability. Memory problems. Problems falling asleep or staying asleep. Talk with your health care provider about treatment options for your symptoms. Do I need hormone replacement therapy? Hormone replacement therapy is effective in treating symptoms that are caused by menopause, such as hot flashes and night sweats. Hormone replacement carries certain risks, especially as you become older. If you are thinking about using estrogen or estrogen with progestin, discuss the benefits and risks with your health care provider. How can I reduce my risk for heart disease and stroke? The risk of heart disease, heart attack, and stroke increases as you age. One of the causes may be a change in the body's hormones during menopause. This can affect how your body uses dietary fats, triglycerides, and cholesterol. Heart attack and stroke are medical emergencies. There are many things that you can do to help prevent heart disease and stroke. Watch your blood pressure High  blood pressure causes heart disease and increases the risk of stroke. This is more likely to develop in people who have high blood pressure readings or are overweight. Have your blood pressure checked: Every 3-5 years if you are 18-39 years of age. Every year if you are 40 years old or older. Eat a healthy diet  Eat a diet that includes plenty of vegetables, fruits, low-fat dairy products, and lean protein. Do not eat a lot of foods that are high in solid fats, added sugars, or sodium. Get regular exercise Get regular exercise. This is one of the most important things you can do for your health. Most adults should: Try to exercise for at least 150 minutes each week. The exercise should increase your heart rate and make you sweat (moderate-intensity exercise). Try to do strengthening exercises at least twice each week. Do these in addition to the moderate-intensity exercise. Spend less time sitting. Even light physical activity can be beneficial. Other tips Work with your health care provider to achieve or maintain a healthy weight. Do not use any products that contain nicotine or tobacco. These products include cigarettes, chewing tobacco, and vaping devices, such as e-cigarettes. If you need help quitting, ask your health care provider. Know your numbers. Ask your health care provider to check your cholesterol and your blood sugar (glucose). Continue to have your blood tested as directed by your health care provider. Do I need screening for cancer? Depending on your health history and family history, you may need to have cancer screenings at different stages of your life. This may include screening for: Breast cancer. Cervical cancer. Lung cancer. Colorectal cancer. What is my risk for osteoporosis? After menopause, you may be   at increased risk for osteoporosis. Osteoporosis is a condition in which bone destruction happens more quickly than new bone creation. To help prevent osteoporosis or  the bone fractures that can happen because of osteoporosis, you may take the following actions: If you are 19-50 years old, get at least 1,000 mg of calcium and at least 600 international units (IU) of vitamin D per day. If you are older than age 50 but younger than age 70, get at least 1,200 mg of calcium and at least 600 international units (IU) of vitamin D per day. If you are older than age 70, get at least 1,200 mg of calcium and at least 800 international units (IU) of vitamin D per day. Smoking and drinking excessive alcohol increase the risk of osteoporosis. Eat foods that are rich in calcium and vitamin D, and do weight-bearing exercises several times each week as directed by your health care provider. How does menopause affect my mental health? Depression may occur at any age, but it is more common as you become older. Common symptoms of depression include: Feeling depressed. Changes in sleep patterns. Changes in appetite or eating patterns. Feeling an overall lack of motivation or enjoyment of activities that you previously enjoyed. Frequent crying spells. Talk with your health care provider if you think that you are experiencing any of these symptoms. General instructions See your health care provider for regular wellness exams and vaccines. This may include: Scheduling regular health, dental, and eye exams. Getting and maintaining your vaccines. These include: Influenza vaccine. Get this vaccine each year before the flu season begins. Pneumonia vaccine. Shingles vaccine. Tetanus, diphtheria, and pertussis (Tdap) booster vaccine. Your health care provider may also recommend other immunizations. Tell your health care provider if you have ever been abused or do not feel safe at home. Summary Menopause is a normal process in which your ability to get pregnant comes to an end. This condition causes hot flashes, night sweats, decreased interest in sex, mood swings, headaches, or lack  of sleep. Treatment for this condition may include hormone replacement therapy. Take actions to keep yourself healthy, including exercising regularly, eating a healthy diet, watching your weight, and checking your blood pressure and blood sugar levels. Get screened for cancer and depression. Make sure that you are up to date with all your vaccines. This information is not intended to replace advice given to you by your health care provider. Make sure you discuss any questions you have with your health care provider. Document Revised: 10/14/2020 Document Reviewed: 10/14/2020 Elsevier Patient Education  2023 Elsevier Inc.  

## 2022-08-05 NOTE — Assessment & Plan Note (Signed)
Encourage diet and exercise for weight loss 

## 2022-08-05 NOTE — Progress Notes (Signed)
Subjective:    Patient ID: Vanessa Camacho, female    DOB: 11-01-1955, 67 y.o.   MRN: [id number]  HPI  Patient presents to clinic today for annual exam.  Flu: 03/2022 Tetanus: 01/2020 COVID: Moderna x 2 Pneumovax: 12/2017 Prevnar: 03/2021 Shingrix: Never Pap smear: 01/2020 Mammogram: 12/2014 Bone density: never Colon screening: never Vision screening: as needed Dentist: as needed  Diet: She does eat meat. She consumes fruits and veggies. She does eat some fried foods. She drinks mostly water, juice, tea. Exercise: dancing  Review of Systems     Past Medical History:  Diagnosis Date   Allergy    Anxiety    Arthritis    Chronic kidney disease    stage 4 renal failure past urologist was in [place]   Depression    Hypertension    Stroke [facility])    Urinary incontinence     Current Outpatient Medications  Medication Sig Dispense Refill   amLODipine (NORVASC) 10 MG tablet TAKE 1 TABLET BY MOUTH EVERY DAY 90 tablet 1   aspirin 81 MG chewable tablet Chew by mouth daily.     atorvastatin (LIPITOR) 20 MG tablet TAKE 1 TABLET BY MOUTH EVERY DAY 90 tablet 1   calcitRIOL (ROCALTROL) 0.25 MCG capsule Take 0.25 mcg by mouth every other day.     citalopram (CELEXA) 20 MG tablet TAKE 1 TABLET BY MOUTH EVERY DAY 90 tablet 0   hydrOXYzine (ATARAX/VISTARIL) 10 MG tablet Take 10 mg by mouth 3 (three) times daily as needed.     loratadine (CLARITIN) 10 MG tablet TAKE 1 TABLET BY MOUTH EVERY DAY 90 tablet 3   metoprolol succinate (TOPROL-XL) 25 MG 24 hr tablet TAKE 1 TABLET BY MOUTH EVERY DAY 90 tablet 0   ondansetron (ZOFRAN) 4 MG tablet Take 1 tablet (4 mg total) by mouth every 8 (eight) hours as needed for nausea or vomiting. 20 tablet 0   No current facility-administered medications for this visit.    No Known Allergies  Family History  Problem Relation Age of Onset   Stroke Mother    Dementia Mother    Hypertension Mother    Cancer Father    Brain cancer Father    Skin cancer  Sister     Social History   Socioeconomic History   Marital status: Married    Spouse name: Not on file   Number of children: Not on file   Years of education: Not on file   Highest education level: Not on file  Occupational History   Not on file  Tobacco Use   Smoking status: Every Day    Packs/day: 0.25    Years: 40.00    Total pack years: 10.00    Types: Cigarettes    Last attempt to quit: 01/01/2015    Years since quitting: 7.5   Smokeless tobacco: Never  Vaping Use   Vaping Use: Never used  Substance and Sexual Activity   Alcohol use: Not Currently    Alcohol/week: 0.0 standard drinks of alcohol    Comment: occassional   Drug use: Yes    Frequency: 3.0 times per week    Types: Marijuana    Comment: enjoyment   Sexual activity: Not Currently  Other Topics Concern   Not on file  Social History Narrative   Not on file   Social Determinants of Health   Financial Resource Strain: Low Risk  (01/16/2020)   Overall Financial Resource Strain (CARDIA)    Difficulty of Paying  Living Expenses: Not hard at all  Food Insecurity: No Food Insecurity (01/16/2020)   Hunger Vital Sign    Worried About Running Out of Food in the Last Year: Never true    Ran Out of Food in the Last Year: Never true  Transportation Needs: No Transportation Needs (01/16/2020)   PRAPARE - Hydrologist (Medical): No    Lack of Transportation (Non-Medical): No  Physical Activity: Inactive (01/16/2020)   Exercise Vital Sign    Days of Exercise per Week: 0 days    Minutes of Exercise per Session: 0 min  Stress: No Stress Concern Present (01/16/2020)   [place]    Feeling of Stress : Not at all  Social Connections: Not on file  Intimate Partner Violence: Not on file     Constitutional: Denies fever, malaise, fatigue, headache or abrupt weight changes.  HEENT: Pt reports left ear fullness. Denies eye pain,  eye redness, ear pain, ringing in the ears, wax buildup, runny nose, nasal congestion, bloody nose, or sore throat. Respiratory: Denies difficulty breathing, shortness of breath, cough or sputum production.   Cardiovascular: Denies chest pain, chest tightness, palpitations or swelling in the hands or feet.  Gastrointestinal: Denies abdominal pain, bloating, constipation, diarrhea or blood in the stool.  GU: Pt reports urinary frequency. Denies urgency, pain with urination, burning sensation, blood in urine, odor or discharge. Musculoskeletal: Denies decrease in range of motion, difficulty with gait, muscle pain or joint pain and swelling.  Skin: Denies redness, rashes, lesions or ulcercations.  Neurological: Denies dizziness, difficulty with memory, difficulty with speech or problems with balance and coordination.  Psych: Patient has a history of anxiety and depression.  Denies SI/HI.  No other specific complaints in a complete review of systems (except as listed in HPI above).  Objective:   Physical Exam  BP 126/74 (BP Location: Left Arm, Patient Position: Sitting, Cuff Size: Normal)   Pulse 77   Temp (!) 96.8 F (36 C) (Temporal)   Wt 183 lb (83 kg)   SpO2 99%   BMI 29.09 kg/m   Wt Readings from Last 3 Encounters:  02/02/22 171 lb (77.6 kg)  12/30/21 173 lb (78.5 kg)  04/02/21 170 lb 3.2 oz (77.2 kg)    General: Appears her stated age, overweight, in NAD. Skin: Warm, dry and intact. No ulcerations noted. HEENT: Head: normal shape and size; Eyes: sclera white, no icterus, conjunctiva pink, PERRLA and EOMs intact; Ears: Tm's gray and intact, normal light reflex;  Neck:  Neck supple, trachea midline. No masses, lumps or thyromegaly present.  Cardiovascular: Normal rate and rhythm. S1,S2 noted.  No murmur, rubs or gallops noted. No JVD or BLE edema. No carotid bruits noted. Pulmonary/Chest: Normal effort and positive vesicular breath sounds. No respiratory distress. No wheezes, rales  or ronchi noted.  Abdomen: Normal bowel sounds.  Musculoskeletal: Strength 5/5 BUE/BLE. No difficulty with gait.  Neurological: Alert and oriented. Cranial nerves II-XII grossly intact. Coordination normal.  Psychiatric: Mood and affect normal. Behavior is normal. Judgment and thought content normal.     BMET    Component Value Date/Time   NA 140 02/02/2022 0933   NA 144 12/21/2014 0907   NA 143 04/11/2014 1344   K 4.0 02/02/2022 0933   K 4.8 04/11/2014 1344   CL 105 02/02/2022 0933   CL 108 (H) 04/11/2014 1344   CO2 23 02/02/2022 0933   CO2 27 04/11/2014 1344  GLUCOSE 113 (H) 02/02/2022 0933   GLUCOSE 132 (H) 04/11/2014 1344   BUN 49 (H) 02/02/2022 0933   BUN 30 (H) 12/21/2014 0907   BUN 38 (H) 04/11/2014 1344   CREATININE 3.57 (H) 02/02/2022 0933   CALCIUM 9.2 02/02/2022 0933   CALCIUM 8.7 04/11/2014 1344   GFRNONAA 19 (L) 01/25/2020 0941   GFRAA 22 (L) 01/25/2020 0941    Lipid Panel     Component Value Date/Time   CHOL 133 02/02/2022 0933   CHOL 124 12/21/2014 0907   TRIG 83 02/02/2022 0933   HDL 47 (L) 02/02/2022 0933   HDL 40 12/21/2014 0907   CHOLHDL 2.8 02/02/2022 0933   LDLCALC 69 02/02/2022 0933    CBC    Component Value Date/Time   WBC 8.5 12/30/2021 1344   RBC 4.69 12/30/2021 1344   HGB 14.4 12/30/2021 1344   HCT 43.5 12/30/2021 1344   PLT 168 12/30/2021 1344   MCV 92.8 12/30/2021 1344   MCH 30.7 12/30/2021 1344   MCHC 33.1 12/30/2021 1344   RDW 12.1 12/30/2021 1344   LYMPHSABS 1,997 01/25/2020 0941   MONOABS 0.7 08/03/2019 1049   EOSABS 179 01/25/2020 0941   BASOSABS 31 01/25/2020 0941    Hgb A1C Lab Results  Component Value Date   HGBA1C 5.4 01/25/2020            Assessment & Plan:   Preventative Health Maintenance:  Encouraged her to get a flu shot in the fall Tetanus UTD Encouraged her to get her COVID booster Pneumovax and Prevnar UTD Discussed Shingrix vaccine, she will check coverage with her insurance company and  schedule a visit if she would like to have this done She no longer needs to screen for cervical cancer Mammogram and bone density ordered-she will call to schedule Cologuard ordered Encouraged her to consume a balanced diet and exercise regimen Advised her to see an eye doctor and dentist annually We will check CBC, c-Met, lipid, A1c today  RTC in 6 months, follow-up chronic conditions Webb Silversmith, NP

## 2022-08-06 LAB — COMPLETE METABOLIC PANEL WITH GFR
AG Ratio: 2.2 (calc) (ref 1.0–2.5)
ALT: 7 U/L (ref 6–29)
AST: 9 U/L — ABNORMAL LOW (ref 10–35)
Albumin: 4.3 g/dL (ref 3.6–5.1)
Alkaline phosphatase (APISO): 81 U/L (ref 37–153)
BUN/Creatinine Ratio: 12 (calc) (ref 6–22)
BUN: 32 mg/dL — ABNORMAL HIGH (ref 7–25)
CO2: 28 mmol/L (ref 20–32)
Calcium: 8.8 mg/dL (ref 8.6–10.4)
Chloride: 107 mmol/L (ref 98–110)
Creat: 2.6 mg/dL — ABNORMAL HIGH (ref 0.50–1.05)
Globulin: 2 g/dL (calc) (ref 1.9–3.7)
Glucose, Bld: 119 mg/dL — ABNORMAL HIGH (ref 65–99)
Potassium: 4.4 mmol/L (ref 3.5–5.3)
Sodium: 141 mmol/L (ref 135–146)
Total Bilirubin: 0.3 mg/dL (ref 0.2–1.2)
Total Protein: 6.3 g/dL (ref 6.1–8.1)
eGFR: 20 mL/min/{1.73_m2} — ABNORMAL LOW (ref >=60)

## 2022-08-06 LAB — CBC
HCT: 40.9 % (ref 35.0–45.0)
Hemoglobin: 13.5 g/dL (ref 11.7–15.5)
MCH: 30.5 pg (ref 27.0–33.0)
MCHC: 33 g/dL (ref 32.0–36.0)
MCV: 92.3 fL (ref 80.0–100.0)
MPV: 12.4 fL (ref 7.5–12.5)
Platelets: 168 10*3/uL (ref 140–400)
RBC: 4.43 10*6/uL (ref 3.80–5.10)
RDW: 11.9 % (ref 11.0–15.0)
WBC: 11.2 10*3/uL — ABNORMAL HIGH (ref 3.8–10.8)

## 2022-08-06 LAB — HEMOGLOBIN A1C
Hgb A1c MFr Bld: 5.7 % of total Hgb — ABNORMAL HIGH (ref <5.7)
Mean Plasma Glucose: 117 mg/dL
eAG (mmol/L): 6.5 mmol/L

## 2022-08-06 LAB — LIPID PANEL
Cholesterol: 109 mg/dL (ref <200)
HDL: 51 mg/dL (ref >=50)
LDL Cholesterol (Calc): 44 mg/dL (calc)
Non-HDL Cholesterol (Calc): 58 mg/dL (calc) (ref <130)
Total CHOL/HDL Ratio: 2.1 (calc) (ref <5.0)
Triglycerides: 60 mg/dL (ref <150)

## 2022-08-31 ENCOUNTER — Ambulatory Visit: Payer: Self-pay

## 2022-08-31 ENCOUNTER — Encounter: Payer: Self-pay | Admitting: Internal Medicine

## 2022-08-31 ENCOUNTER — Ambulatory Visit (INDEPENDENT_AMBULATORY_CARE_PROVIDER_SITE_OTHER): Payer: Medicare Other | Admitting: Internal Medicine

## 2022-08-31 VITALS — BP 124/80 | HR 64 | Temp 96.9°F | Wt 186.0 lb

## 2022-08-31 DIAGNOSIS — H1013 Acute atopic conjunctivitis, bilateral: Secondary | ICD-10-CM | POA: Diagnosis not present

## 2022-08-31 MED ORDER — OLOPATADINE HCL 0.2 % OP SOLN: 1.0000 [drp] | Freq: Every day | OPHTHALMIC | 0 refills | Status: AC

## 2022-08-31 NOTE — Patient Instructions (Signed)
Allergic Conjunctivitis, Adult  Allergic conjunctivitis is inflammation of the clear membrane that covers the white part of your eye and the inner surface of your eyelid. This area is called the conjunctiva. This condition can make your eye red or pink. It can also make your eye feel itchy. This condition is not contagious. This means that it cannot be spread from one person to another person. What are the causes? This condition is caused by allergens. These are things that can cause an allergic reaction in some people. Common allergens include: Outdoor allergens, such as: Pollen, including pollen from grass and weeds. Mold. Car fumes. Indoor allergens, such as: Dust. Smoke. Mold. Proteins in a pet's pee (urine), saliva, or dander. Proteins that build up in contact lenses. What increases the risk? You are more likely to develop this condition if you have a family history of these things: Allergies. Conditions that you get because of allergens, such as asthma or inflammation of the skin (eczema). What are the signs or symptoms? Symptoms of this condition include eyes that are: Itchy. Red. Watery. Puffy. Your eyes may also: Sting or burn. Have clear fluid draining from them. Have thick mucus coming from them. This happens in severe cases. How is this treated? Treatment for this condition may include: Using cold, wet cloths (cold compresses) to soothe itching and swelling. Washing the face, hair, and clothing to remove allergens. Using eye drops. These may include: Eye drops that block allergies. Eye drops that reduce swelling and irritation. Steroid eye drops if other treatments have not worked. Oral antihistamine medicines. These medicines lessen your allergies. You may need these if eye drops do not help or are difficult to use. Air purifier at home and work. Wrap around sunglasses. This may help to block allergens from reaching the eye. Not wearing contact lenses, if the  doctor has found that contact lenses caused your symptoms. Use daily wear disposal contact lenses instead. Follow these instructions at home: Eye care Place a cool, clean washcloth on your eye for 10-20 minutes. Do this 3-4 times a day. Do not touch or rub your eyes. Do not wear contact lenses until the inflammation is gone. Wear glasses instead. Do not wear eye makeup until the inflammation is gone. General instructions Try not to be around things that you are allergic to. Take or apply over-the-counter and prescription medicines only as told by your doctor. These include any eye drops. Drink enough fluid to keep your pee pale yellow. Keep all follow-up visits. Contact a doctor if: Your symptoms get worse. Your symptoms do not get better with treatment. You have mild eye pain. You are sensitive to light. You have spots or blisters on your eyes. You have pus coming from your eye. You have a fever. Get help right away if: You have redness, swelling, or other symptoms in only one eye. You cannot see well. You have other vision changes. You have very bad eye pain. Summary Allergic conjunctivitis is caused by allergens. It can make your eye red or pink, and it can make your eye feel itchy. This condition cannot be spread from one person to another person. Avoid things that you are allergic to. Take or apply over-the-counter and prescription medicines only as told by your doctor. These include any eye drops. Contact your doctor if your symptoms get worse or they do not get better with treatment. This information is not intended to replace advice given to you by your health care provider. Make sure you   discuss any questions you have with your health care provider. Document Revised: 08/01/2021 Document Reviewed: 08/01/2021 Elsevier Patient Education  2023 Elsevier Inc.  

## 2022-08-31 NOTE — Telephone Encounter (Signed)
Chief Complaint: Swelling to both eyes on the outside Symptoms: decreased hearing, nasal congestion, eye drainage to both eyes Frequency: Onset since Saturday for eye swelling Pertinent Negatives: Patient denies other symptoms Disposition: [] ED /[] Urgent Care (no appt availability in office) / [x] Appointment(In office/virtual)/ []  [facility]/ [] Home Care/ [] Refused Recommended Disposition /[] [place] [facility]/ []  Follow-up with PCP Additional Notes: N/A   Reason for Disposition  MODERATE-SEVERE eyelid swelling on one side  (Exception: Due to a mosquito bite.)  Answer Assessment - Initial Assessment Questions 1. ONSET: "When did the swelling start?" (e.g., minutes, hours, days)     [place] 2. LOCATION: "What part of the eyelids is swollen?"     Around the outside both eyes 3. SEVERITY: "How swollen is it?"     Moderate-severe 4. ITCHING: "Is there any itching?" If Yes, ask: "How much?"   (Scale 1-10; mild, moderate or severe)     8-10 at times 5. PAIN: "Is the swelling painful to touch?" If Yes, ask: "How painful is it?"   (Scale 1-10; mild, moderate or severe)     Not really 6. FEVER: "Do you have a fever?" If Yes, ask: "What is it, how was it measured, and when did it start?"      No 7. CAUSE: "What do you think is causing the swelling?"     Allergies, sinus 8. RECURRENT SYMPTOM: "Have you had eyelid swelling before?" If Yes, ask: "When was the last time?" "What happened that time?"     Yes, not this bad 9. OTHER SYMPTOMS: "Do you have any other symptoms?" (e.g., blurred vision, eye discharge, rash, runny nose)     Decreased hearing to the left ear for a few months, eye discharge, nasal congestion  Protocols used: Eye - Swelling-A-AH

## 2022-08-31 NOTE — Progress Notes (Signed)
Subjective:    Patient ID: Vanessa Camacho, female    DOB: May 23, 1956, 67 y.o.   MRN: [id number]  HPI  Patient presents to clinic today with complaint of eye swelling, matting and nasal congestion.  She reports this started 2 days ago.  She reports both of her eyes are red and she has no swelling around her eyes.  She has not really noticed any vision changes.  She reports her eyes are matted and goopy first thing in the morning but this improves throughout the day.  She reports the nasal congestion seems to be controlled with Flonase, Claritin and Benadryl OTC.  She denies fever, chills or bodyaches.  She has tried warm compresses with minimal relief of symptoms.  Review of Systems     Past Medical History:  Diagnosis Date   Allergy    Anxiety    Arthritis    Chronic kidney disease    stage 4 renal failure past urologist was in [place]   Depression    Hypertension    Stroke [facility])    Urinary incontinence     Current Outpatient Medications  Medication Sig Dispense Refill   amLODipine (NORVASC) 10 MG tablet TAKE 1 TABLET BY MOUTH EVERY DAY 90 tablet 1   aspirin 81 MG chewable tablet Chew by mouth daily.     atorvastatin (LIPITOR) 20 MG tablet TAKE 1 TABLET BY MOUTH EVERY DAY 90 tablet 1   calcitRIOL (ROCALTROL) 0.25 MCG capsule Take 0.25 mcg by mouth every other day.     citalopram (CELEXA) 20 MG tablet TAKE 1 TABLET BY MOUTH EVERY DAY 90 tablet 0   hydrOXYzine (ATARAX/VISTARIL) 10 MG tablet Take 10 mg by mouth 3 (three) times daily as needed.     loratadine (CLARITIN) 10 MG tablet TAKE 1 TABLET BY MOUTH EVERY DAY 90 tablet 3   metoprolol succinate (TOPROL-XL) 25 MG 24 hr tablet TAKE 1 TABLET BY MOUTH EVERY DAY 90 tablet 0   omeprazole (PRILOSEC) 20 MG capsule Take 1 capsule (20 mg total) by mouth daily. 90 capsule 1   ondansetron (ZOFRAN-ODT) 8 MG disintegrating tablet Take 1 tablet (8 mg total) by mouth every 8 (eight) hours as needed for nausea or vomiting. 20 tablet 0   No  current facility-administered medications for this visit.    No Known Allergies  Family History  Problem Relation Age of Onset   Stroke Mother    Dementia Mother    Hypertension Mother    Cancer Father    Brain cancer Father    Skin cancer Sister     Social History   Socioeconomic History   Marital status: Married    Spouse name: Not on file   Number of children: Not on file   Years of education: Not on file   Highest education level: Not on file  Occupational History   Not on file  Tobacco Use   Smoking status: Former    Packs/day: 0.25    Years: 40.00    Additional pack years: 0.00    Total pack years: 10.00    Types: Cigarettes    Quit date: 01/01/2015    Years since quitting: 7.6   Smokeless tobacco: Never  Vaping Use   Vaping Use: Never used  Substance and Sexual Activity   Alcohol use: Not Currently    Alcohol/week: 0.0 standard drinks of alcohol    Comment: occassional   Drug use: Yes    Frequency: 3.0 times per week  Types: Marijuana    Comment: enjoyment   Sexual activity: Not Currently  Other Topics Concern   Not on file  Social History Narrative   Not on file   Social Determinants of Health   Financial Resource Strain: Low Risk  (01/16/2020)   Overall Financial Resource Strain (CARDIA)    Difficulty of Paying Living Expenses: Not hard at all  Food Insecurity: No Food Insecurity (01/16/2020)   Hunger Vital Sign    Worried About Running Out of Food in the Last Year: Never true    Ran Out of Food in the Last Year: Never true  Transportation Needs: No Transportation Needs (01/16/2020)   PRAPARE - Hydrologist (Medical): No    Lack of Transportation (Non-Medical): No  Physical Activity: Inactive (01/16/2020)   Exercise Vital Sign    Days of Exercise per Week: 0 days    Minutes of Exercise per Session: 0 min  Stress: No Stress Concern Present (01/16/2020)   [place]    Feeling of Stress : Not at all  Social Connections: Not on file  Intimate Partner Violence: Not on file     Constitutional: Denies fever, malaise, fatigue, headache or abrupt weight changes.  HEENT: Patient reports eye swelling, eye discharge and nasal congestion.  Denies eye pain, eye redness, ear pain, ringing in the ears, wax buildup, runny nose, bloody nose, or sore throat. Respiratory: Denies difficulty breathing, shortness of breath, cough or sputum production.   Cardiovascular: Denies chest pain, chest tightness, palpitations or swelling in the hands or feet.  Psych: Denies anxiety, depression, SI/HI.  No other specific complaints in a complete review of systems (except as listed in HPI above).  Objective:   Physical Exam BP 124/80 (BP Location: Left Arm, Patient Position: Sitting, Cuff Size: Normal)   Pulse 64   Temp (!) 96.9 F (36.1 C) (Temporal)   Wt 186 lb (84.4 kg)   SpO2 95%   BMI 29.57 kg/m   Wt Readings from Last 3 Encounters:  08/05/22 183 lb (83 kg)  02/02/22 171 lb (77.6 kg)  12/30/21 173 lb (78.5 kg)    General: Appears her stated age, overweight, chronically ill-appearing, in NAD. Skin: Warm, dry and intact. No rashes noted. HEENT: Head: normal shape and size; Eyes: sclera injected, no icterus, conjunctiva erythematous without pus noted in the conjunctival sac, PERRLA and EOMs intact, bilateral periorbital edema noted; Ears: Tm's gray and intact, normal light reflex; Nose: mucosa pink and moist, septum midline;   Neck: No adenopathy noted. Cardiovascular: Normal rate and rhythm.  Pulmonary/Chest: Normal effort and positive vesicular breath sounds. No respiratory distress. No wheezes, rales or ronchi noted.  Musculoskeletal: No difficulty with gait.  Neurological: Alert and oriented.     BMET    Component Value Date/Time   NA 141 08/05/2022 0841   NA 144 12/21/2014 0907   NA 143 04/11/2014 1344   K 4.4 08/05/2022 0841   K 4.8  04/11/2014 1344   CL 107 08/05/2022 0841   CL 108 (H) 04/11/2014 1344   CO2 28 08/05/2022 0841   CO2 27 04/11/2014 1344   GLUCOSE 119 (H) 08/05/2022 0841   GLUCOSE 132 (H) 04/11/2014 1344   BUN 32 (H) 08/05/2022 0841   BUN 30 (H) 12/21/2014 0907   BUN 38 (H) 04/11/2014 1344   CREATININE 2.60 (H) 08/05/2022 0841   CALCIUM 8.8 08/05/2022 0841   CALCIUM 8.7 04/11/2014 1344  GFRNONAA 19 (L) 01/25/2020 0941   GFRAA 22 (L) 01/25/2020 0941    Lipid Panel     Component Value Date/Time   CHOL 109 08/05/2022 0841   CHOL 124 12/21/2014 0907   TRIG 60 08/05/2022 0841   HDL 51 08/05/2022 0841   HDL 40 12/21/2014 0907   CHOLHDL 2.1 08/05/2022 0841   LDLCALC 44 08/05/2022 0841    CBC    Component Value Date/Time   WBC 11.2 (H) 08/05/2022 0841   RBC 4.43 08/05/2022 0841   HGB 13.5 08/05/2022 0841   HCT 40.9 08/05/2022 0841   PLT 168 08/05/2022 0841   MCV 92.3 08/05/2022 0841   MCH 30.5 08/05/2022 0841   MCHC 33.0 08/05/2022 0841   RDW 11.9 08/05/2022 0841   LYMPHSABS 1,997 01/25/2020 0941   MONOABS 0.7 08/03/2019 1049   EOSABS 179 01/25/2020 0941   BASOSABS 31 01/25/2020 0941    Hgb A1C Lab Results  Component Value Date   HGBA1C 5.7 (H) 08/05/2022            Assessment & Plan:   Allergic Conjunctivitis:  Encouraged her to use cool compresses instead of warm compresses Rx for Pataday 1 drop each eye daily x 7 days Continue Claritin, Flonase and Benadryl OTC  RTC in 5 months for follow-up of chronic conditions Webb Silversmith, NP

## 2022-10-09 ENCOUNTER — Other Ambulatory Visit: Payer: Self-pay | Admitting: Internal Medicine

## 2022-10-09 DIAGNOSIS — F418 Other specified anxiety disorders: Secondary | ICD-10-CM

## 2022-10-09 NOTE — Telephone Encounter (Signed)
Requested Prescriptions  Pending Prescriptions Disp Refills   citalopram (CELEXA) 20 MG tablet [Pharmacy Med Name: CITALOPRAM HBR 20 MG TABLET] 90 tablet 1    Sig: TAKE 1 TABLET BY MOUTH EVERY DAY     Psychiatry:  Antidepressants - SSRI Passed - 10/09/2022  2:17 AM      Passed - Completed PHQ-2 or PHQ-9 in the last 360 days      Passed - Valid encounter within last 6 months    Recent Outpatient Visits           1 month ago Allergic conjunctivitis of both eyes   [place] [facility] [place], Salvadore Oxford, NP   2 months ago Encounter for general adult medical examination with abnormal findings   [place] [facility] [place], Salvadore Oxford, NP   8 months ago Pure hypercholesterolemia   [place] [facility] [place], [place] W, NP   9 months ago Nausea and vomiting, unspecified vomiting type   [place] [facility] [place], Salvadore Oxford, NP   1 year ago Need for immunization against influenza   [facility] [place], Salvadore Oxford, NP       Future Appointments             In 3 months Baity, Salvadore Oxford, NP [place] [facility], [facility]

## 2022-10-16 ENCOUNTER — Telehealth: Payer: Self-pay | Admitting: Internal Medicine

## 2022-10-16 ENCOUNTER — Other Ambulatory Visit: Payer: Self-pay | Admitting: Internal Medicine

## 2022-10-16 DIAGNOSIS — E785 Hyperlipidemia, unspecified: Secondary | ICD-10-CM

## 2022-10-16 NOTE — Telephone Encounter (Signed)
Requested Prescriptions  Pending Prescriptions Disp Refills   atorvastatin (LIPITOR) 20 MG tablet [Pharmacy Med Name: ATORVASTATIN 20 MG TABLET] 90 tablet 1    Sig: TAKE 1 TABLET BY MOUTH EVERY DAY     Cardiovascular:  Antilipid - Statins Failed - 10/16/2022  2:38 AM      Failed - Lipid Panel in normal range within the last 12 months    Cholesterol, Total  Date Value Ref Range Status  12/21/2014 124 100 - 199 mg/dL Final   Cholesterol  Date Value Ref Range Status  08/05/2022 109 <200 mg/dL Final   LDL Cholesterol (Calc)  Date Value Ref Range Status  08/05/2022 44 mg/dL (calc) Final    Comment:    Reference range: <100 . Desirable range <100 mg/dL for primary prevention;   <70 mg/dL for patients with CHD or diabetic patients  with > or = 2 CHD risk factors. Marland Kitchen LDL-C is now calculated using the Martin-Hopkins  calculation, which is a validated novel method providing  better accuracy than the Friedewald equation in the  estimation of LDL-C.  Horald Pollen et al. Lenox Ahr. 1191;478(29): 2061-2068  (http://education.[employer].com/faq/FAQ164)    HDL  Date Value Ref Range Status  08/05/2022 51 > OR = 50 mg/dL Final  56/21/3086 40 >57 mg/dL Final    Comment:    According to ATP-III Guidelines, HDL-C >59 mg/dL is considered a negative risk factor for CHD.    Triglycerides  Date Value Ref Range Status  08/05/2022 60 <150 mg/dL Final         Passed - Patient is not pregnant      Passed - Valid encounter within last 12 months    Recent Outpatient Visits           1 month ago Allergic conjunctivitis of both eyes   [place] [facility] [place], Salvadore Oxford, NP   2 months ago Encounter for general adult medical examination with abnormal findings   [place] [facility] [place], Salvadore Oxford, NP   8 months ago Pure hypercholesterolemia   [place] [facility] [place], [place], NP   9 months ago Nausea and vomiting, unspecified  vomiting type   [place] [facility] [place], Salvadore Oxford, NP   1 year ago Need for immunization against influenza   [facility] [place], Salvadore Oxford, NP       Future Appointments             In 3 months Baity, Salvadore Oxford, NP  [facility], [facility]

## 2022-10-16 NOTE — Telephone Encounter (Signed)
Called patient to schedule Medicare Annual Wellness Visit (AWV). Left message for patient to call back and schedule Medicare Annual Wellness Visit (AWV).  Last date of AWV: 04/02/21  Please schedule an appointment at any time with Kennedy Bucker, LPN  .  If any questions, please contact me.  Thank you ,  Verlee Rossetti; Care Guide Ambulatory Clinical Support [place] [facility] Direct Dial: [phone]

## 2022-10-26 ENCOUNTER — Ambulatory Visit
Admission: RE | Admit: 2022-10-26 | Discharge: 2022-10-26 | Disposition: A | Discharge status: Home / Self Care | Payer: Medicare Other | Source: Ambulatory Visit | Attending: Internal Medicine | Admitting: Internal Medicine

## 2022-10-26 DIAGNOSIS — E2839 Other primary ovarian failure: Secondary | ICD-10-CM | POA: Insufficient documentation

## 2022-10-26 DIAGNOSIS — M810 Age-related osteoporosis without current pathological fracture: Secondary | ICD-10-CM | POA: Diagnosis not present

## 2022-10-26 DIAGNOSIS — Z1231 Encounter for screening mammogram for malignant neoplasm of breast: Secondary | ICD-10-CM | POA: Insufficient documentation

## 2022-10-29 ENCOUNTER — Telehealth: Payer: Self-pay | Admitting: *Deleted

## 2022-10-29 DIAGNOSIS — M810 Age-related osteoporosis without current pathological fracture: Secondary | ICD-10-CM | POA: Insufficient documentation

## 2022-10-29 NOTE — Telephone Encounter (Signed)
See results note.   Thanks,   -Moriah Loughry  

## 2022-10-29 NOTE — Telephone Encounter (Signed)
  Chief Complaint: Results/Follow up message Symptoms: NA Frequency: NA Pertinent Negatives: Patient denies NA Disposition: [] ED /[] Urgent Care (no appt availability in office) / [] Appointment(In office/virtual)/ []  [place] [facility]/ [] Home Care/ [] Refused Recommended Disposition /[] [place] [facility]/ . Follow-up with PCP Additional Notes:  Message from PCP read to pt, verblizes understanding. States she would like to move forward with the injections. States she will be out of town next 2 weeks.

## 2022-10-29 NOTE — Addendum Note (Signed)
Addended by: Lorre Munroe on: 10/29/2022 02:55 PM   Modules accepted: Orders

## 2022-11-03 ENCOUNTER — Other Ambulatory Visit: Payer: Self-pay | Admitting: Internal Medicine

## 2022-11-03 DIAGNOSIS — I10 Essential (primary) hypertension: Secondary | ICD-10-CM

## 2022-11-03 DIAGNOSIS — N184 Chronic kidney disease, stage 4 (severe): Secondary | ICD-10-CM

## 2022-11-03 NOTE — Telephone Encounter (Signed)
Medication Refill - Medication: amLODipine (NORVASC) 10 MG tablet   Has the patient contacted their pharmacy? Yes.   Pharmacy stated patient needed to contact provider  Preferred Pharmacy (with phone number or street name):  [id number] [place], [place] - [address] [place] Phone: [phone]  Fax: [phone]     Has the patient been seen for an appointment in the last year OR does the patient have an upcoming appointment? No.  Agent: Please be advised that RX refills may take up to 3 business days. We ask that you follow-up with your pharmacy.

## 2022-11-04 NOTE — Telephone Encounter (Signed)
Requested medication (s) are due for refill today: yes  Requested medication (s) are on the active medication list: yes  Last refill:  02/20/20 #90 1 RF  Future visit scheduled: yes  Notes to clinic:  please review med and if appropriate to RF   Requested Prescriptions  Pending Prescriptions Disp Refills   amLODipine (NORVASC) 10 MG tablet 90 tablet 1    Sig: Take 1 tablet (10 mg total) by mouth daily.     Cardiovascular: Calcium Channel Blockers 2 Passed - 11/03/2022  1:37 PM      Passed - Last BP in normal range    BP Readings from Last 1 Encounters:  08/31/22 124/80         Passed - Last Heart Rate in normal range    Pulse Readings from Last 1 Encounters:  08/31/22 64         Passed - Valid encounter within last 6 months    Recent Outpatient Visits           2 months ago Allergic conjunctivitis of both eyes   [place] [facility] [place], Salvadore Oxford, NP   3 months ago Encounter for general adult medical examination with abnormal findings   [place] [facility] [place], Salvadore Oxford, NP   9 months ago Pure hypercholesterolemia   [place] [facility] [place], [place], NP   10 months ago Nausea and vomiting, unspecified vomiting type   [place] [facility] [place], Salvadore Oxford, NP   1 year ago Need for immunization against influenza   [facility] [place], Salvadore Oxford, NP       Future Appointments             In 3 months Baity, Salvadore Oxford, NP [place] [facility], [facility]

## 2022-11-05 MED ORDER — AMLODIPINE BESYLATE 10 MG PO TABS: 10.0000 mg | ORAL_TABLET | Freq: Every day | ORAL | 0 refills | Status: DC

## 2022-11-09 ENCOUNTER — Other Ambulatory Visit: Payer: Self-pay

## 2022-11-09 DIAGNOSIS — I10 Essential (primary) hypertension: Secondary | ICD-10-CM

## 2022-11-09 DIAGNOSIS — N184 Chronic kidney disease, stage 4 (severe): Secondary | ICD-10-CM

## 2022-11-09 MED ORDER — AMLODIPINE BESYLATE 10 MG PO TABS: 10.0000 mg | ORAL_TABLET | Freq: Every day | ORAL | 0 refills | Status: DC

## 2022-11-16 ENCOUNTER — Other Ambulatory Visit: Payer: Self-pay | Admitting: Internal Medicine

## 2022-11-16 DIAGNOSIS — I10 Essential (primary) hypertension: Secondary | ICD-10-CM

## 2022-11-16 NOTE — Telephone Encounter (Signed)
Medication Refill - Medication: metoprolol succinate (TOPROL-XL) 25 MG 24 hr tablet   Has the patient contacted their pharmacy? Yes.   (Agent: If no, request that the patient contact the pharmacy for the refill. If patient does not wish to contact the pharmacy document the reason why and proceed with request.) (Agent: If yes, when and what did the pharmacy advise?)  Preferred Pharmacy (with phone number or street name):  [id number] [place], [place] - [address] AT [address] [place] Phone: [phone]  Fax: [phone]     Has the patient been seen for an appointment in the last year OR does the patient have an upcoming appointment? Yes.    Agent: Please be advised that RX refills may take up to 3 business days. We ask that you follow-up with your pharmacy.

## 2022-11-17 MED ORDER — METOPROLOL SUCCINATE ER 25 MG PO TB24: 25.0000 mg | ORAL_TABLET | Freq: Every day | ORAL | 0 refills | Status: DC

## 2022-11-17 NOTE — Telephone Encounter (Signed)
Requested medications are due for refill today.  unsure  Requested medications are on the active medications list.  yes  Last refill. 04/21/2020 #90 0 rf  Future visit scheduled.   yes  Notes to clinic.  Medication last refill 04/21/2020 - Rx signed by Danielle Rankin.    Requested Prescriptions  Pending Prescriptions Disp Refills   metoprolol succinate (TOPROL-XL) 25 MG 24 hr tablet 90 tablet 0    Sig: Take 1 tablet (25 mg total) by mouth daily.     Cardiovascular:  Beta Blockers Passed - 11/16/2022  4:08 PM      Passed - Last BP in normal range    BP Readings from Last 1 Encounters:  08/31/22 124/80         Passed - Last Heart Rate in normal range    Pulse Readings from Last 1 Encounters:  08/31/22 64         Passed - Valid encounter within last 6 months    Recent Outpatient Visits           2 months ago Allergic conjunctivitis of both eyes   [place] [facility] [place], Salvadore Oxford, NP   3 months ago Encounter for general adult medical examination with abnormal findings   [place] [facility] [place], Salvadore Oxford, NP   9 months ago Pure hypercholesterolemia   [place] [facility] [place], [place], NP   10 months ago Nausea and vomiting, unspecified vomiting type   [place] [facility] [place] [place], Salvadore Oxford, NP   1 year ago Need for immunization against influenza   [facility] [place], Salvadore Oxford, NP       Future Appointments             In 2 months Baity, Salvadore Oxford, NP  [facility], [facility]

## 2022-12-24 ENCOUNTER — Ambulatory Visit (INDEPENDENT_AMBULATORY_CARE_PROVIDER_SITE_OTHER): Payer: Medicare HMO

## 2022-12-24 VITALS — Ht 66.0 in | Wt 195.0 lb

## 2022-12-24 DIAGNOSIS — Z0000 Encounter for general adult medical examination without abnormal findings: Secondary | ICD-10-CM | POA: Diagnosis not present

## 2022-12-24 NOTE — Progress Notes (Addendum)
Subjective:   Vanessa Camacho is a 67 y.o. female who presents for Medicare Annual (Subsequent) preventive examination.  Visit Complete: Virtual  I connected with  Townsend Roger on 12/24/22 by a audio enabled telemedicine application and verified that I am speaking with the correct person using two identifiers.  Patient Location: Home  Provider Location: Office/Clinic  I discussed the limitations of evaluation and management by telemedicine. The patient expressed understanding and agreed to proceed.  Per patient no change in vitals since last visit, unable to obtain new vitals due to telehealth visit     Review of Systems     Cardiac Risk Factors include: advanced age (>68men, >42 women);hypertension;obesity (BMI >30kg/m2)     Objective:    Today's Vitals   12/24/22 1201  Weight: 195 lb (88.5 kg)  Height: 5\' 6"  (1.676 m)   Body mass index is 31.47 kg/m.     12/24/2022   12:16 PM 01/16/2020   11:47 AM  Advanced Directives  Does Patient Have a Medical Advance Directive? No No    Current Medications (verified) Outpatient Encounter Medications as of 12/24/2022  Medication Sig   amLODipine (NORVASC) 10 MG tablet Take 1 tablet (10 mg total) by mouth daily.   aspirin 81 MG chewable tablet Chew by mouth daily.   atorvastatin (LIPITOR) 20 MG tablet TAKE 1 TABLET BY MOUTH EVERY DAY   citalopram (CELEXA) 20 MG tablet TAKE 1 TABLET BY MOUTH EVERY DAY   diphenhydrAMINE (BENADRYL) 25 mg capsule Take 25 mg by mouth at bedtime as needed.   hydrOXYzine (ATARAX/VISTARIL) 10 MG tablet Take 10 mg by mouth 3 (three) times daily as needed.   loratadine (CLARITIN) 10 MG tablet TAKE 1 TABLET BY MOUTH EVERY DAY   metoprolol succinate (TOPROL-XL) 25 MG 24 hr tablet Take 1 tablet (25 mg total) by mouth daily.   Olopatadine HCl 0.2 % SOLN Apply 1 drop to eye daily.   omeprazole (PRILOSEC) 20 MG capsule Take 1 capsule (20 mg total) by mouth daily.   ondansetron (ZOFRAN-ODT) 8 MG  disintegrating tablet Take 1 tablet (8 mg total) by mouth every 8 (eight) hours as needed for nausea or vomiting.   calcitRIOL (ROCALTROL) 0.25 MCG capsule Take 0.25 mcg by mouth every other day. (Patient not taking: Reported on 12/24/2022)   [DISCONTINUED] fluticasone (FLONASE) 50 MCG/ACT nasal spray Place 2 sprays into both nostrils daily. (Patient taking differently: Place 2 sprays into both nostrils as needed. )   No facility-administered encounter medications on file as of 12/24/2022.    Allergies (verified) Patient has no known allergies.   History: Past Medical History:  Diagnosis Date   Allergy    Anxiety    Arthritis    Chronic kidney disease    stage 4 renal failure past urologist was in [place]   Depression    Hypertension    Stroke [facility])    Urinary incontinence    Past Surgical History:  Procedure Laterality Date   CHOLECYSTECTOMY     Family History  Problem Relation Age of Onset   Stroke Mother    Dementia Mother    Hypertension Mother    Cancer Father    Brain cancer Father    Skin cancer Sister    Social History   Socioeconomic History   Marital status: Married    Spouse name: Not on file   Number of children: Not on file   Years of education: Not on file   Highest education level: Not  on file  Occupational History   Not on file  Tobacco Use   Smoking status: Former    Current packs/day: 0.00    Average packs/day: 0.3 packs/day for 40.0 years (10.0 ttl pk-yrs)    Types: Cigarettes    Start date: 01/01/1975    Quit date: 01/01/2015    Years since quitting: 7.9   Smokeless tobacco: Never  Vaping Use   Vaping status: Never Used  Substance and Sexual Activity   Alcohol use: Not Currently    Alcohol/week: 0.0 standard drinks of alcohol    Comment: occassional   Drug use: Yes    Frequency: 3.0 times per week    Types: Marijuana    Comment: enjoyment   Sexual activity: Not Currently  Other Topics Concern   Not on file  Social History Narrative    Not on file   Social Determinants of Health   Financial Resource Strain: Low Risk  (12/24/2022)   Overall Financial Resource Strain (CARDIA)    Difficulty of Paying Living Expenses: Not hard at all  Food Insecurity: No Food Insecurity (12/24/2022)   Hunger Vital Sign    Worried About Running Out of Food in the Last Year: Never true    Ran Out of Food in the Last Year: Never true  Transportation Needs: No Transportation Needs (12/24/2022)   PRAPARE - Administrator, Civil Service (Medical): No    Lack of Transportation (Non-Medical): No  Physical Activity: Inactive (12/24/2022)   Exercise Vital Sign    Days of Exercise per Week: 0 days    Minutes of Exercise per Session: 0 min  Stress: No Stress Concern Present (12/24/2022)   [employer] of Occupational Health - Occupational Stress Questionnaire    Feeling of Stress : Not at all  Social Connections: Socially Isolated (12/24/2022)   Social Connection and Isolation Panel [NHANES]    Frequency of Communication with Friends and Family: More than three times a week    Frequency of Social Gatherings with Friends and Family: More than three times a week    Attends Religious Services: Never    Database administrator or Organizations: No    Attends Engineer, structural: Never    Marital Status: Divorced    Tobacco Counseling Counseling given: Not Answered   Clinical Intake:  Pre-visit preparation completed: Yes  Pain : No/denies pain     Nutritional Status: BMI > 30  Obese Nutritional Risks: None Diabetes: No  How often do you need to have someone help you when you read instructions, pamphlets, or other written materials from your doctor or pharmacy?: 1 - Never  Interpreter Needed?: No  Information entered by :: NAllen LPN   Activities of Daily Living    12/24/2022   12:03 PM 08/31/2022    2:47 PM  In your present state of health, do you have any difficulty performing the following activities:   Hearing? 0 1  Comment hears buzzing in ears   Vision? 0 1  Difficulty concentrating or making decisions? 0 0  Walking or climbing stairs? 1 0  Dressing or bathing? 0 0  Doing errands, shopping? 0 0  Preparing Food and eating ? N   Using the Toilet? N   In the past six months, have you accidently leaked urine? Y   Comment Rene Kocher is aware   Do you have problems with loss of bowel control? Y   Comment once or twice a month   Managing your  Medications? N   Managing your Finances? N   Housekeeping or managing your Housekeeping? N     Patient Care Team: Lorre Munroe, NP as PCP - General (Internal Medicine)  Indicate any recent Medical Services you may have received from other than [facility] providers in the past year (date may be approximate).     Assessment:   This is a routine wellness examination for Mikal.  Hearing/Vision screen Hearing Screening - Comments:: Hears buzzing in ears Vision Screening - Comments:: No regular eye exams,   Dietary issues and exercise activities discussed:     Goals Addressed             This Visit's Progress    Patient Stated       12/24/2022, wants to lose weight       Depression Screen    12/24/2022   12:21 PM 08/31/2022    2:47 PM 08/05/2022    8:40 AM 02/02/2022   11:16 AM 12/30/2021    1:31 PM 04/02/2021    8:09 AM 09/30/2020    2:29 PM  PHQ 2/9 Scores  PHQ - 2 Score 0 0 0 0 0 0 0  PHQ- 9 Score 0 0  1 0 0 4    Fall Risk    12/24/2022   12:18 PM 08/31/2022    2:47 PM 08/05/2022    8:41 AM 02/02/2022   11:16 AM 12/30/2021    1:27 PM  Fall Risk   Falls in the past year? 0 0 0 0 1  Number falls in past yr: 0   0 1  Injury with Fall? 0 0 0 0 0  Risk for fall due to : Medication side effect  No Fall Risks No Fall Risks History of fall(s)  Follow up Falls prevention discussed;Falls evaluation completed   Falls evaluation completed Falls evaluation completed    MEDICARE RISK AT HOME:  Medicare Risk at Home - 12/24/22 1219      Any stairs in or around the home? Yes    If so, are there any without handrails? Yes    Home free of loose throw rugs in walkways, pet beds, electrical cords, etc? Yes    Adequate lighting in your home to reduce risk of falls? Yes    Life alert? No    Use of a cane, walker or w/c? No    Grab bars in the bathroom? Yes    Shower chair or bench in shower? No    Elevated toilet seat or a handicapped toilet? No             TIMED UP AND GO:  Was the test performed?  No    Cognitive Function:        12/24/2022   12:22 PM 01/16/2020   11:52 AM  6CIT Screen  What Year? 0 points 0 points  What month? 0 points 0 points  What time? 3 points 0 points  Count back from 20 0 points 0 points  Months in reverse 4 points 2 points  Repeat phrase 2 points 2 points  Total Score 9 points 4 points    Immunizations Immunization History  Administered Date(s) Administered   Fluad Quad(high Dose 65+) 04/02/2021   Influenza,inj,Quad PF,6+ Mos 02/21/2019, 05/06/2020   Moderna Sars-Covid-2 Vaccination 10/19/2019, 11/16/2019   PNEUMOCOCCAL CONJUGATE-20 04/02/2021   Pneumococcal Polysaccharide-23 01/05/2018   Tdap 01/25/2020    TDAP status: Up to date  Flu Vaccine status: Up to date  Pneumococcal vaccine  status: Up to date  Covid-19 vaccine status: Information provided on how to obtain vaccines.   Qualifies for Shingles Vaccine? Yes   Zostavax completed No   Shingrix Completed?: No.    Education has been provided regarding the importance of this vaccine. Patient has been advised to call insurance company to determine out of pocket expense if they have not yet received this vaccine. Advised may also receive vaccine at local pharmacy or Health Dept. Verbalized acceptance and understanding.  Screening Tests Health Maintenance  Topic Date Due   Fecal DNA (Cologuard)  Never done   Zoster Vaccines- Shingrix (1 of 2) Never done   COVID-19 Vaccine (3 - 2023-24 season) 02/06/2022    INFLUENZA VACCINE  01/07/2023   Medicare Annual Wellness (AWV)  12/24/2023   MAMMOGRAM  10/25/2024   DTaP/Tdap/Td (2 - Td or Tdap) 01/24/2030   Pneumonia Vaccine 23+ Years old  Completed   DEXA SCAN  Completed   Hepatitis C Screening  Completed   HPV VACCINES  Aged Out    Health Maintenance  Health Maintenance Due  Topic Date Due   Fecal DNA (Cologuard)  Never done   Zoster Vaccines- Shingrix (1 of 2) Never done   COVID-19 Vaccine (3 - 2023-24 season) 02/06/2022    Colorectal cancer screening: has cologuard kit   Mammogram status: Completed 10/26/2022. Repeat every year  Bone Density status: Completed 10/26/2022.   Lung Cancer Screening: (Low Dose CT Chest recommended if Age 42-80 years, 20 pack-year currently smoking OR have quit w/in 15years.) does not qualify.   Lung Cancer Screening Referral: no  Additional Screening:  Hepatitis C Screening: does qualify; Completed 04/02/2021  Vision Screening: Recommended annual ophthalmology exams for early detection of glaucoma and other disorders of the eye. Is the patient up to date with their annual eye exam?  No  Who is the provider or what is the name of the office in which the patient attends annual eye exams? Can't remember name If pt is not established with a provider, would they like to be referred to a provider to establish care? No .   Dental Screening: Recommended annual dental exams for proper oral hygiene  Diabetic Foot Exam: n/a  Community Resource Referral / Chronic Care Management: CRR required this visit?  No   CCM required this visit?  No     Plan:     I have personally reviewed and noted the following in the patient's chart:   Medical and social history Use of alcohol, tobacco or illicit drugs  Current medications and supplements including opioid prescriptions. Patient is not currently taking opioid prescriptions. Functional ability and status Nutritional status Physical activity Advanced  directives List of other physicians Hospitalizations, surgeries, and ER visits in previous 12 months Vitals Screenings to include cognitive, depression, and falls Referrals and appointments  In addition, I have reviewed and discussed with patient certain preventive protocols, quality metrics, and best practice recommendations. A written personalized care plan for preventive services as well as general preventive health recommendations were provided to patient.     Barb Merino, LPN   1/61/0960   After Visit Summary: (Pick Up) Due to this being a telephonic visit, with patients personalized plan was offered to patient and patient has requested to Pick up at office.  Nurse Notes: none

## 2022-12-24 NOTE — Patient Instructions (Signed)
Vanessa Camacho , Thank you for taking time to come for your Medicare Wellness Visit. I appreciate your ongoing commitment to your health goals. Please review the following plan we discussed and let me know if I can assist you in the future.   These are the goals we discussed:  Goals      Patient Stated     01/16/2020, wants to set up apartment     Patient Stated     12/24/2022, wants to lose weight        This is a list of the screening recommended for you and due dates:  Health Maintenance  Topic Date Due   Cologuard (Stool DNA test)  Never done   Zoster (Shingles) Vaccine (1 of 2) Never done   COVID-19 Vaccine (3 - 2023-24 season) 02/06/2022   Flu Shot  01/07/2023   Medicare Annual Wellness Visit  12/24/2023   Mammogram  10/25/2024   DTaP/Tdap/Td vaccine (2 - Td or Tdap) 01/24/2030   Pneumonia Vaccine  Completed   DEXA scan (bone density measurement)  Completed   Hepatitis C Screening  Completed   HPV Vaccine  Aged Out    Advanced directives: Advance directive discussed with you today.   Conditions/risks identified: none  Next appointment: Follow up in one year for your annual wellness visit    Preventive Care 65 Years and Older, Female Preventive care refers to lifestyle choices and visits with your health care provider that can promote health and wellness. What does preventive care include? A yearly physical exam. This is also called an annual well check. Dental exams once or twice a year. Routine eye exams. Ask your health care provider how often you should have your eyes checked. Personal lifestyle choices, including: Daily care of your teeth and gums. Regular physical activity. Eating a healthy diet. Avoiding tobacco and drug use. Limiting alcohol use. Practicing safe sex. Taking low-dose aspirin every day. Taking vitamin and mineral supplements as recommended by your health care provider. What happens during an annual well check? The services and screenings done  by your health care provider during your annual well check will depend on your age, overall health, lifestyle risk factors, and family history of disease. Counseling  Your health care provider may ask you questions about your: Alcohol use. Tobacco use. Drug use. Emotional well-being. Home and relationship well-being. Sexual activity. Eating habits. History of falls. Memory and ability to understand (cognition). Work and work Astronomer. Reproductive health. Screening  You may have the following tests or measurements: Height, weight, and BMI. Blood pressure. Lipid and cholesterol levels. These may be checked every 5 years, or more frequently if you are over 4 years old. Skin check. Lung cancer screening. You may have this screening every year starting at age 7 if you have a 30-pack-year history of smoking and currently smoke or have quit within the past 15 years. Fecal occult blood test (FOBT) of the stool. You may have this test every year starting at age 69. Flexible sigmoidoscopy or colonoscopy. You may have a sigmoidoscopy every 5 years or a colonoscopy every 10 years starting at age 16. Hepatitis C blood test. Hepatitis B blood test. Sexually transmitted disease (STD) testing. Diabetes screening. This is done by checking your blood sugar (glucose) after you have not eaten for a while (fasting). You may have this done every 1-3 years. Bone density scan. This is done to screen for osteoporosis. You may have this done starting at age 74. Mammogram. This may be  done every 1-2 years. Talk to your health care provider about how often you should have regular mammograms. Talk with your health care provider about your test results, treatment options, and if necessary, the need for more tests. Vaccines  Your health care provider may recommend certain vaccines, such as: Influenza vaccine. This is recommended every year. Tetanus, diphtheria, and acellular pertussis (Tdap, Td) vaccine. You  may need a Td booster every 10 years. Zoster vaccine. You may need this after age 25. Pneumococcal 13-valent conjugate (PCV13) vaccine. One dose is recommended after age 75. Pneumococcal polysaccharide (PPSV23) vaccine. One dose is recommended after age 7. Talk to your health care provider about which screenings and vaccines you need and how often you need them. This information is not intended to replace advice given to you by your health care provider. Make sure you discuss any questions you have with your health care provider. Document Released: 06/21/2015 Document Revised: 02/12/2016 Document Reviewed: 03/26/2015 Elsevier Interactive Patient Education  2017 [employer].  Fall Prevention in the Home Falls can cause injuries. They can happen to people of all ages. There are many things you can do to make your home safe and to help prevent falls. What can I do on the outside of my home? Regularly fix the edges of walkways and driveways and fix any cracks. Remove anything that might make you trip as you walk through a door, such as a raised step or threshold. Trim any bushes or trees on the path to your home. Use bright outdoor lighting. Clear any walking paths of anything that might make someone trip, such as rocks or tools. Regularly check to see if handrails are loose or broken. Make sure that both sides of any steps have handrails. Any raised decks and porches should have guardrails on the edges. Have any leaves, snow, or ice cleared regularly. Use sand or salt on walking paths during winter. Clean up any spills in your garage right away. This includes oil or grease spills. What can I do in the bathroom? Use night lights. Install grab bars by the toilet and in the tub and shower. Do not use towel bars as grab bars. Use non-skid mats or decals in the tub or shower. If you need to sit down in the shower, use a plastic, non-slip stool. Keep the floor dry. Clean up any water that spills  on the floor as soon as it happens. Remove soap buildup in the tub or shower regularly. Attach bath mats securely with double-sided non-slip rug tape. Do not have throw rugs and other things on the floor that can make you trip. What can I do in the bedroom? Use night lights. Make sure that you have a light by your bed that is easy to reach. Do not use any sheets or blankets that are too big for your bed. They should not hang down onto the floor. Have a firm chair that has side arms. You can use this for support while you get dressed. Do not have throw rugs and other things on the floor that can make you trip. What can I do in the kitchen? Clean up any spills right away. Avoid walking on wet floors. Keep items that you use a lot in easy-to-reach places. If you need to reach something above you, use a strong step stool that has a grab bar. Keep electrical cords out of the way. Do not use floor polish or wax that makes floors slippery. If you must use wax,  use non-skid floor wax. Do not have throw rugs and other things on the floor that can make you trip. What can I do with my stairs? Do not leave any items on the stairs. Make sure that there are handrails on both sides of the stairs and use them. Fix handrails that are broken or loose. Make sure that handrails are as long as the stairways. Check any carpeting to make sure that it is firmly attached to the stairs. Fix any carpet that is loose or worn. Avoid having throw rugs at the top or bottom of the stairs. If you do have throw rugs, attach them to the floor with carpet tape. Make sure that you have a light switch at the top of the stairs and the bottom of the stairs. If you do not have them, ask someone to add them for you. What else can I do to help prevent falls? Wear shoes that: Do not have high heels. Have rubber bottoms. Are comfortable and fit you well. Are closed at the toe. Do not wear sandals. If you use a stepladder: Make  sure that it is fully opened. Do not climb a closed stepladder. Make sure that both sides of the stepladder are locked into place. Ask someone to hold it for you, if possible. Clearly mark and make sure that you can see: Any grab bars or handrails. First and last steps. Where the edge of each step is. Use tools that help you move around (mobility aids) if they are needed. These include: Canes. Walkers. Scooters. Crutches. Turn on the lights when you go into a dark area. Replace any light bulbs as soon as they burn out. Set up your furniture so you have a clear path. Avoid moving your furniture around. If any of your floors are uneven, fix them. If there are any pets around you, be aware of where they are. Review your medicines with your doctor. Some medicines can make you feel dizzy. This can increase your chance of falling. Ask your doctor what other things that you can do to help prevent falls. This information is not intended to replace advice given to you by your health care provider. Make sure you discuss any questions you have with your health care provider. Document Released: 03/21/2009 Document Revised: 10/31/2015 Document Reviewed: 06/29/2014 Elsevier Interactive Patient Education  2017 [employer].

## 2023-02-01 ENCOUNTER — Other Ambulatory Visit: Payer: Self-pay | Admitting: Internal Medicine

## 2023-02-02 NOTE — Telephone Encounter (Signed)
Requested Prescriptions  Pending Prescriptions Disp Refills   omeprazole (PRILOSEC) 20 MG capsule [Pharmacy Med Name: OMEPRAZOLE DR 20 MG CAPSULE] 90 capsule 1    Sig: TAKE 1 CAPSULE (20 MG TOTAL) BY MOUTH DAILY.     Gastroenterology: Proton Pump Inhibitors Passed - 02/01/2023  1:22 AM      Passed - Valid encounter within last 12 months    Recent Outpatient Visits           5 months ago Allergic conjunctivitis of both eyes   [place] [facility] [place], Salvadore Oxford, NP   6 months ago Encounter for general adult medical examination with abnormal findings   [employer] [facility] [place], Salvadore Oxford, NP   1 year ago Pure hypercholesterolemia   [place] [facility] [place], Salvadore Oxford, NP   1 year ago Nausea and vomiting, unspecified vomiting type   [place] [facility] [place], Salvadore Oxford, NP   1 year ago Need for immunization against influenza   [facility] [place], Salvadore Oxford, NP       Future Appointments             Tomorrow [place], Salvadore Oxford, NP [place] [facility]

## 2023-02-03 ENCOUNTER — Ambulatory Visit (INDEPENDENT_AMBULATORY_CARE_PROVIDER_SITE_OTHER): Payer: Medicare HMO | Admitting: Internal Medicine

## 2023-02-03 ENCOUNTER — Encounter: Payer: Self-pay | Admitting: Internal Medicine

## 2023-02-03 VITALS — BP 132/84 | HR 66 | Temp 96.4°F | Wt 196.0 lb

## 2023-02-03 DIAGNOSIS — K219 Gastro-esophageal reflux disease without esophagitis: Secondary | ICD-10-CM

## 2023-02-03 DIAGNOSIS — Z23 Encounter for immunization: Secondary | ICD-10-CM

## 2023-02-03 DIAGNOSIS — E6609 Other obesity due to excess calories: Secondary | ICD-10-CM

## 2023-02-03 DIAGNOSIS — Z6831 Body mass index (BMI) 31.0-31.9, adult: Secondary | ICD-10-CM

## 2023-02-03 DIAGNOSIS — I6930 Unspecified sequelae of cerebral infarction: Secondary | ICD-10-CM

## 2023-02-03 DIAGNOSIS — F418 Other specified anxiety disorders: Secondary | ICD-10-CM

## 2023-02-03 DIAGNOSIS — I10 Essential (primary) hypertension: Secondary | ICD-10-CM

## 2023-02-03 DIAGNOSIS — E7800 Pure hypercholesterolemia, unspecified: Secondary | ICD-10-CM

## 2023-02-03 DIAGNOSIS — M810 Age-related osteoporosis without current pathological fracture: Secondary | ICD-10-CM | POA: Diagnosis not present

## 2023-02-03 DIAGNOSIS — N184 Chronic kidney disease, stage 4 (severe): Secondary | ICD-10-CM | POA: Diagnosis not present

## 2023-02-03 DIAGNOSIS — R7303 Prediabetes: Secondary | ICD-10-CM | POA: Diagnosis not present

## 2023-02-03 DIAGNOSIS — N2581 Secondary hyperparathyroidism of renal origin: Secondary | ICD-10-CM

## 2023-02-03 MED ORDER — HYDROXYZINE HCL 10 MG PO TABS: 10.0000 mg | ORAL_TABLET | Freq: Three times a day (TID) | ORAL | 1 refills | Status: AC | PRN

## 2023-02-03 NOTE — Assessment & Plan Note (Signed)
C-Met today Encouraged her to follow-up with nephrology

## 2023-02-03 NOTE — Assessment & Plan Note (Signed)
Encourage low-fat diet C-Met lipid profile today Continue amlodipine, metoprolol, atorvastatin and aspirin

## 2023-02-03 NOTE — Assessment & Plan Note (Signed)
C-Met and lipid profile today Encouraged her to consume a low-fat diet Continue atorvastatin 

## 2023-02-03 NOTE — Assessment & Plan Note (Signed)
A1c today Encourage low-carb diet and exercise for weight loss 

## 2023-02-03 NOTE — Assessment & Plan Note (Signed)
Encourage diet and exercise for weight loss 

## 2023-02-03 NOTE — Assessment & Plan Note (Signed)
Encouraged daily weightbearing exercise Advised her to call endocrinology to schedule her initial evaluation appointment

## 2023-02-03 NOTE — Assessment & Plan Note (Signed)
Controlled on amlodipine and metoprolol Reinforced DASH diet and exercise for weight loss C-Met today

## 2023-02-03 NOTE — Assessment & Plan Note (Signed)
Stable on her current dose of citalopram, buspirone and hydroxyzine Support offered

## 2023-02-03 NOTE — Assessment & Plan Note (Signed)
C-Met today Advised her to follow-up with nephrology

## 2023-02-03 NOTE — Patient Instructions (Signed)

## 2023-02-03 NOTE — Assessment & Plan Note (Signed)
Try to identify and avoid foods that trigger reflux Encouraged also as this can help reduce reflux symptoms Continue omeprazole

## 2023-02-03 NOTE — Progress Notes (Signed)
Subjective:    Patient ID: Vanessa Camacho, female    DOB: 05/12/1956, 67 y.o.   MRN: [id number]  HPI  Patient presents to clinic today for 53-month follow-up of chronic conditions.  Anxiety and depression: Chronic, managed on citalopram, buspirone and hydroxyzine.  She is not currently seeing a therapist.  She denies SI/HI.  HTN: Her BP today is 132/84.  She is taking amlodipine and metoprolol as prescribed.  There is no ECG on file.  HLD status post stroke: No residual effect.  Her last LDL was 44, triglycerides 60, 07/2022.  She denies myalgias on atorvastatin.  She is taking metoprolol and aspirin as well.  She tries to consume a low-fat diet.  She does not follow with neurology.  CKD 4: Her last creatinine was 2.60, GFR 20, 07/2022.  She is not currently on an ACEI/ARB.  She follows with nephrology.  Secondary hyperparathyroidism: Due to CKD: She is prescribed calcitrol but is not taking this.  She follows with nephrology.  GERD: She is not sure what triggers this.  She denies breakthrough on omeprazole.  There is no upper GI on file.  Prediabetes: Her last A1c was 5.7%, 07/2022.  She is not taking any oral diabetic medication at this time.  She does not check her sugars.  Osteoporosis: She is not taking any calcium or vitamin D OTC.  She was referred to endocrinology but has not scheduled this appointment.  Bone density from 10/2022 reviewed.  Review of Systems  Past Medical History:  Diagnosis Date   Allergy    Anxiety    Arthritis    Chronic kidney disease    stage 4 renal failure past urologist was in [place]   Depression    Hypertension    Stroke [facility])    Urinary incontinence     Current Outpatient Medications  Medication Sig Dispense Refill   amLODipine (NORVASC) 10 MG tablet Take 1 tablet (10 mg total) by mouth daily. 90 tablet 0   aspirin 81 MG chewable tablet Chew by mouth daily.     atorvastatin (LIPITOR) 20 MG tablet TAKE 1 TABLET BY MOUTH EVERY DAY 90 tablet 1    calcitRIOL (ROCALTROL) 0.25 MCG capsule Take 0.25 mcg by mouth every other day. (Patient not taking: Reported on 12/24/2022)     citalopram (CELEXA) 20 MG tablet TAKE 1 TABLET BY MOUTH EVERY DAY 90 tablet 1   diphenhydrAMINE (BENADRYL) 25 mg capsule Take 25 mg by mouth at bedtime as needed.     hydrOXYzine (ATARAX/VISTARIL) 10 MG tablet Take 10 mg by mouth 3 (three) times daily as needed.     loratadine (CLARITIN) 10 MG tablet TAKE 1 TABLET BY MOUTH EVERY DAY 90 tablet 3   metoprolol succinate (TOPROL-XL) 25 MG 24 hr tablet Take 1 tablet (25 mg total) by mouth daily. 90 tablet 0   Olopatadine HCl 0.2 % SOLN Apply 1 drop to eye daily. 2.5 mL 0   omeprazole (PRILOSEC) 20 MG capsule TAKE 1 CAPSULE (20 MG TOTAL) BY MOUTH DAILY. 90 capsule 1   ondansetron (ZOFRAN-ODT) 8 MG disintegrating tablet Take 1 tablet (8 mg total) by mouth every 8 (eight) hours as needed for nausea or vomiting. 20 tablet 0   No current facility-administered medications for this visit.    No Known Allergies  Family History  Problem Relation Age of Onset   Stroke Mother    Dementia Mother    Hypertension Mother    Cancer Father    Brain  cancer Father    Skin cancer Sister     Social History   Socioeconomic History   Marital status: Married    Spouse name: Not on file   Number of children: Not on file   Years of education: Not on file   Highest education level: Not on file  Occupational History   Not on file  Tobacco Use   Smoking status: Former    Current packs/day: 0.00    Average packs/day: 0.3 packs/day for 40.0 years (10.0 ttl pk-yrs)    Types: Cigarettes    Start date: 01/01/1975    Quit date: 01/01/2015    Years since quitting: 8.0   Smokeless tobacco: Never  Vaping Use   Vaping status: Never Used  Substance and Sexual Activity   Alcohol use: Not Currently    Alcohol/week: 0.0 standard drinks of alcohol    Comment: occassional   Drug use: Yes    Frequency: 3.0 times per week    Types:  Marijuana    Comment: enjoyment   Sexual activity: Not Currently  Other Topics Concern   Not on file  Social History Narrative   Not on file   Social Determinants of Health   Financial Resource Strain: Low Risk  (12/24/2022)   Overall Financial Resource Strain (CARDIA)    Difficulty of Paying Living Expenses: Not hard at all  Food Insecurity: No Food Insecurity (12/24/2022)   Hunger Vital Sign    Worried About Running Out of Food in the Last Year: Never true    Ran Out of Food in the Last Year: Never true  Transportation Needs: No Transportation Needs (12/24/2022)   PRAPARE - Administrator, Civil Service (Medical): No    Lack of Transportation (Non-Medical): No  Physical Activity: Inactive (12/24/2022)   Exercise Vital Sign    Days of Exercise per Week: 0 days    Minutes of Exercise per Session: 0 min  Stress: No Stress Concern Present (12/24/2022)   [employer] of Occupational Health - Occupational Stress Questionnaire    Feeling of Stress : Not at all  Social Connections: Socially Isolated (12/24/2022)   Social Connection and Isolation Panel [NHANES]    Frequency of Communication with Friends and Family: More than three times a week    Frequency of Social Gatherings with Friends and Family: More than three times a week    Attends Religious Services: Never    Database administrator or Organizations: No    Attends Banker Meetings: Never    Marital Status: Divorced  Catering manager Violence: Not At Risk (12/24/2022)   Humiliation, Afraid, Rape, and Kick questionnaire    Fear of Current or Ex-Partner: No    Emotionally Abused: No    Physically Abused: No    Sexually Abused: No     Constitutional: Denies fever, malaise, fatigue, headache or abrupt weight changes.  HEENT: Denies eye pain, eye redness, ear pain, ringing in the ears, wax buildup, runny nose, nasal congestion, bloody nose, or sore throat. Respiratory: Denies difficulty breathing,  shortness of breath, cough or sputum production.   Cardiovascular: Denies chest pain, chest tightness, palpitations or swelling in the hands or feet.  Gastrointestinal: Denies abdominal pain, bloating, constipation, diarrhea or blood in the stool.  GU: Denies urgency, frequency, pain with urination, burning sensation, blood in urine, odor or discharge. Musculoskeletal: Denies decrease in range of motion, difficulty with gait, muscle pain or joint pain and swelling.  Skin: Denies redness, rashes,  lesions or ulcercations.  Neurological: Denies dizziness, difficulty with memory, difficulty with speech or problems with balance and coordination.  Psych: Patient has a history of anxiety and depression.  Denies SI/HI.  No other specific complaints in a complete review of systems (except as listed in HPI above).     Objective:   Physical Exam   BP 132/84 (BP Location: Left Arm, Patient Position: Sitting, Cuff Size: Normal)   Pulse 66   Temp (!) 96.4 F (35.8 C) (Temporal)   Wt 196 lb (88.9 kg)   SpO2 96%   BMI 31.64 kg/m   Wt Readings from Last 3 Encounters:  12/24/22 195 lb (88.5 kg)  08/31/22 186 lb (84.4 kg)  08/05/22 183 lb (83 kg)    General: Appears her stated age, obese, in NAD. Skin: Warm, dry and intact.  HEENT: Head: normal shape and size; Eyes: sclera white, no icterus, conjunctiva pink, PERRLA and EOMs intact;  Cardiovascular: Normal rate and rhythm. S1,S2 noted.  No murmur, rubs or gallops noted. No JVD or BLE edema. No carotid bruits noted. Pulmonary/Chest: Normal effort and positive vesicular breath sounds. No respiratory distress. No wheezes, rales or ronchi noted.  Abdomen: Normal bowel sounds.  Musculoskeletal: No difficulty with gait.  Neurological: Alert and oriented. Coordination normal.  Psychiatric: Mood and affect normal. Behavior is normal. Judgment and thought content normal.    BMET    Component Value Date/Time   NA 141 08/05/2022 0841   NA 144  12/21/2014 0907   NA 143 04/11/2014 1344   K 4.4 08/05/2022 0841   K 4.8 04/11/2014 1344   CL 107 08/05/2022 0841   CL 108 (H) 04/11/2014 1344   CO2 28 08/05/2022 0841   CO2 27 04/11/2014 1344   GLUCOSE 119 (H) 08/05/2022 0841   GLUCOSE 132 (H) 04/11/2014 1344   BUN 32 (H) 08/05/2022 0841   BUN 30 (H) 12/21/2014 0907   BUN 38 (H) 04/11/2014 1344   CREATININE 2.60 (H) 08/05/2022 0841   CALCIUM 8.8 08/05/2022 0841   CALCIUM 8.7 04/11/2014 1344   GFRNONAA 19 (L) 01/25/2020 0941   GFRAA 22 (L) 01/25/2020 0941    Lipid Panel     Component Value Date/Time   CHOL 109 08/05/2022 0841   CHOL 124 12/21/2014 0907   TRIG 60 08/05/2022 0841   HDL 51 08/05/2022 0841   HDL 40 12/21/2014 0907   CHOLHDL 2.1 08/05/2022 0841   LDLCALC 44 08/05/2022 0841    CBC    Component Value Date/Time   WBC 11.2 (H) 08/05/2022 0841   RBC 4.43 08/05/2022 0841   HGB 13.5 08/05/2022 0841   HCT 40.9 08/05/2022 0841   PLT 168 08/05/2022 0841   MCV 92.3 08/05/2022 0841   MCH 30.5 08/05/2022 0841   MCHC 33.0 08/05/2022 0841   RDW 11.9 08/05/2022 0841   LYMPHSABS 1,997 01/25/2020 0941   MONOABS 0.7 08/03/2019 1049   EOSABS 179 01/25/2020 0941   BASOSABS 31 01/25/2020 0941    Hgb A1C Lab Results  Component Value Date   HGBA1C 5.7 (H) 08/05/2022           Assessment & Plan:     RTC in 6 months for your annual exam Nicki Reaper, NP

## 2023-02-04 LAB — CBC
HCT: 41.9 % (ref 35.0–45.0)
Hemoglobin: 13.6 g/dL (ref 11.7–15.5)
MCH: 30.1 pg (ref 27.0–33.0)
MCHC: 32.5 g/dL (ref 32.0–36.0)
MCV: 92.7 fL (ref 80.0–100.0)
MPV: 12 fL (ref 7.5–12.5)
Platelets: 191 10*3/uL (ref 140–400)
RBC: 4.52 10*6/uL (ref 3.80–5.10)
RDW: 11.9 % (ref 11.0–15.0)
WBC: 10.9 10*3/uL — ABNORMAL HIGH (ref 3.8–10.8)

## 2023-02-04 LAB — LIPID PANEL
Cholesterol: 128 mg/dL (ref <200)
HDL: 49 mg/dL — ABNORMAL LOW (ref >=50)
LDL Cholesterol (Calc): 62 mg/dL
Non-HDL Cholesterol (Calc): 79 mg/dL (ref <130)
Total CHOL/HDL Ratio: 2.6 (calc) (ref <5.0)
Triglycerides: 86 mg/dL (ref <150)

## 2023-02-04 LAB — COMPLETE METABOLIC PANEL WITH GFR
AG Ratio: 1.5 (calc) (ref 1.0–2.5)
ALT: 9 U/L (ref 6–29)
AST: 10 U/L (ref 10–35)
Albumin: 4.3 g/dL (ref 3.6–5.1)
Alkaline phosphatase (APISO): 122 U/L (ref 37–153)
BUN/Creatinine Ratio: 15 (calc) (ref 6–22)
BUN: 55 mg/dL — ABNORMAL HIGH (ref 7–25)
CO2: 24 mmol/L (ref 20–32)
Calcium: 9.6 mg/dL (ref 8.6–10.4)
Chloride: 107 mmol/L (ref 98–110)
Creat: 3.7 mg/dL — ABNORMAL HIGH (ref 0.50–1.05)
Globulin: 2.8 g/dL (ref 1.9–3.7)
Glucose, Bld: 133 mg/dL — ABNORMAL HIGH (ref 65–99)
Potassium: 4.7 mmol/L (ref 3.5–5.3)
Sodium: 140 mmol/L (ref 135–146)
Total Bilirubin: 0.5 mg/dL (ref 0.2–1.2)
Total Protein: 7.1 g/dL (ref 6.1–8.1)
eGFR: 13 mL/min/{1.73_m2} — ABNORMAL LOW (ref >=60)

## 2023-02-04 LAB — HEMOGLOBIN A1C
Hgb A1c MFr Bld: 6.1 %{Hb} — ABNORMAL HIGH (ref <5.7)
Mean Plasma Glucose: 128 mg/dL
eAG (mmol/L): 7.1 mmol/L

## 2023-02-17 ENCOUNTER — Other Ambulatory Visit: Payer: Self-pay | Admitting: Internal Medicine

## 2023-02-17 DIAGNOSIS — I10 Essential (primary) hypertension: Secondary | ICD-10-CM

## 2023-02-18 NOTE — Telephone Encounter (Signed)
Requested Prescriptions  Pending Prescriptions Disp Refills   metoprolol succinate (TOPROL-XL) 25 MG 24 hr tablet [Pharmacy Med Name: METOPROLOL SUCC ER 25 MG TAB] 90 tablet 0    Sig: TAKE 1 TABLET (25 MG TOTAL) BY MOUTH DAILY.     Cardiovascular:  Beta Blockers Passed - 02/17/2023 11:51 AM      Passed - Last BP in normal range    BP Readings from Last 1 Encounters:  02/03/23 132/84         Passed - Last Heart Rate in normal range    Pulse Readings from Last 1 Encounters:  02/03/23 66         Passed - Valid encounter within last 6 months    Recent Outpatient Visits           2 weeks ago Pure hypercholesterolemia   [facility] [place], Salvadore Oxford, NP   5 months ago Allergic conjunctivitis of both eyes   [facility] [place], Salvadore Oxford, NP   6 months ago Encounter for general adult medical examination with abnormal findings   [facility] [place], Salvadore Oxford, NP   1 year ago Pure hypercholesterolemia   [place] [facility] [place], Salvadore Oxford, NP   1 year ago Nausea and vomiting, unspecified vomiting type   [facility] [place], Salvadore Oxford, NP       Future Appointments             In 5 months Baity, Salvadore Oxford, NP  [facility], [facility]

## 2023-04-08 ENCOUNTER — Other Ambulatory Visit: Payer: Self-pay | Admitting: Internal Medicine

## 2023-04-08 DIAGNOSIS — F418 Other specified anxiety disorders: Secondary | ICD-10-CM

## 2023-04-08 NOTE — Telephone Encounter (Signed)
Requested Prescriptions  Pending Prescriptions Disp Refills   citalopram (CELEXA) 20 MG tablet [Pharmacy Med Name: CITALOPRAM HBR 20 MG TABLET] 90 tablet 1    Sig: TAKE 1 TABLET BY MOUTH EVERY DAY     Psychiatry:  Antidepressants - SSRI Passed - 04/08/2023  1:38 AM      Passed - Completed PHQ-2 or PHQ-9 in the last 360 days      Passed - Valid encounter within last 6 months    Recent Outpatient Visits           2 months ago Pure hypercholesterolemia   [facility] [place], Salvadore Oxford, NP   7 months ago Allergic conjunctivitis of both eyes   [facility] [place], Salvadore Oxford, NP   8 months ago Encounter for general adult medical examination with abnormal findings   [place] [facility] [place], Salvadore Oxford, NP   1 year ago Pure hypercholesterolemia   [place] [facility] [place], Salvadore Oxford, NP   1 year ago Nausea and vomiting, unspecified vomiting type   [place] [facility] [place], Salvadore Oxford, NP       Future Appointments             In 4 months Baity, Salvadore Oxford, NP [facility], [facility]

## 2023-04-23 ENCOUNTER — Other Ambulatory Visit: Payer: Self-pay | Admitting: Internal Medicine

## 2023-04-23 DIAGNOSIS — E785 Hyperlipidemia, unspecified: Secondary | ICD-10-CM

## 2023-04-23 NOTE — Telephone Encounter (Signed)
Requested Prescriptions  Pending Prescriptions Disp Refills   atorvastatin (LIPITOR) 20 MG tablet [Pharmacy Med Name: ATORVASTATIN 20 MG TABLET] 90 tablet 0    Sig: TAKE 1 TABLET BY MOUTH EVERY DAY     Cardiovascular:  Antilipid - Statins Failed - 04/23/2023  2:47 AM      Failed - Lipid Panel in normal range within the last 12 months    Cholesterol, Total  Date Value Ref Range Status  12/21/2014 124 100 - 199 mg/dL Final   Cholesterol  Date Value Ref Range Status  02/03/2023 128 <200 mg/dL Final   LDL Cholesterol (Calc)  Date Value Ref Range Status  02/03/2023 62 mg/dL (calc) Final    Comment:    Reference range: <100 . Desirable range <100 mg/dL for primary prevention;   <70 mg/dL for patients with CHD or diabetic patients  with > or = 2 CHD risk factors. Marland Kitchen LDL-C is now calculated using the Martin-Hopkins  calculation, which is a validated novel method providing  better accuracy than the Friedewald equation in the  estimation of LDL-C.  Horald Pollen et al. Lenox Ahr. 6962;952(84): 2061-2068  (http://education.[facility].com/faq/FAQ164)    HDL  Date Value Ref Range Status  02/03/2023 49 (L) > OR = 50 mg/dL Final  13/24/4010 40 >27 mg/dL Final    Comment:    According to ATP-III Guidelines, HDL-C >59 mg/dL is considered a negative risk factor for CHD.    Triglycerides  Date Value Ref Range Status  02/03/2023 86 <150 mg/dL Final         Passed - Patient is not pregnant      Passed - Valid encounter within last 12 months    Recent Outpatient Visits           2 months ago Pure hypercholesterolemia   [place] [facility] [place], Salvadore Oxford, NP   7 months ago Allergic conjunctivitis of both eyes   [place] [facility] [place], Salvadore Oxford, NP   8 months ago Encounter for general adult medical examination with abnormal findings   [place] [facility] [place], Salvadore Oxford, NP   1 year ago Pure hypercholesterolemia   [facility] [place], Salvadore Oxford, NP   1 year ago Nausea and vomiting, unspecified vomiting type   [place] [facility] [place], Salvadore Oxford, NP       Future Appointments             In 3 months Baity, Salvadore Oxford, NP [place] [facility], [facility]

## 2023-05-08 ENCOUNTER — Other Ambulatory Visit: Payer: Self-pay | Admitting: Internal Medicine

## 2023-05-08 DIAGNOSIS — J302 Other seasonal allergic rhinitis: Secondary | ICD-10-CM

## 2023-05-11 NOTE — Telephone Encounter (Signed)
Requested Prescriptions  Pending Prescriptions Disp Refills   loratadine (CLARITIN) 10 MG tablet [Pharmacy Med Name: LORATADINE 10 MG TABLET] 90 tablet 3    Sig: TAKE 1 TABLET BY MOUTH EVERY DAY     Ear, Nose, and Throat:  Antihistamines 2 Failed - 05/08/2023  4:08 PM      Failed - Cr in normal range and within 360 days    Creat  Date Value Ref Range Status  02/03/2023 3.70 (H) 0.50 - 1.05 mg/dL Final         Passed - Valid encounter within last 12 months    Recent Outpatient Visits           3 months ago Pure hypercholesterolemia   [facility] [place], Salvadore Oxford, NP   8 months ago Allergic conjunctivitis of both eyes   [place] [place], Salvadore Oxford, NP   9 months ago Encounter for general adult medical examination with abnormal findings   [place] [place], Salvadore Oxford, NP   1 year ago Pure hypercholesterolemia   [place] [place], Salvadore Oxford, NP   1 year ago Nausea and vomiting, unspecified vomiting type   [place] [facility] [place], Salvadore Oxford, NP       Future Appointments             In 3 months Baity, Salvadore Oxford, NP  [facility], [facility]

## 2023-05-17 ENCOUNTER — Other Ambulatory Visit: Payer: Self-pay | Admitting: Internal Medicine

## 2023-05-17 DIAGNOSIS — I10 Essential (primary) hypertension: Secondary | ICD-10-CM

## 2023-05-17 MED ORDER — METOPROLOL SUCCINATE ER 25 MG PO TB24: 25.0000 mg | ORAL_TABLET | Freq: Every day | ORAL | 1 refills | Status: DC

## 2023-05-18 ENCOUNTER — Other Ambulatory Visit: Payer: Self-pay | Admitting: Internal Medicine

## 2023-05-18 DIAGNOSIS — N184 Chronic kidney disease, stage 4 (severe): Secondary | ICD-10-CM

## 2023-05-18 DIAGNOSIS — I10 Essential (primary) hypertension: Secondary | ICD-10-CM

## 2023-05-18 NOTE — Telephone Encounter (Signed)
Medication Refill -  Most Recent Primary Care Visit:  Provider: Lorre Munroe  Department: [facility]  Visit Type: OFFICE VISIT  Date: 02/03/2023  Medication: amLODipine (NORVASC) 10 MG tablet   Has the patient contacted their pharmacy? Yes  (Agent: If yes, when and what did the pharmacy advise?)  Is this the correct pharmacy for this prescription? Yes If no, delete pharmacy and type the correct one.  This is the patient's preferred pharmacy:   [facility] [address] [place] - [address] [place] [place] [address] Phone: [phone] Fax: [phone]   Has the prescription been filled recently? Yes  Is the patient out of the medication? Yes  Has the patient been seen for an appointment in the last year OR does the patient have an upcoming appointment? Yes  Can we respond through MyChart? No  Agent: Please be advised that Rx refills may take up to 3 business days. We ask that you follow-up with your pharmacy.

## 2023-05-19 MED ORDER — AMLODIPINE BESYLATE 10 MG PO TABS: 10.0000 mg | ORAL_TABLET | Freq: Every day | ORAL | 0 refills | Status: DC

## 2023-05-19 NOTE — Telephone Encounter (Signed)
Requested Prescriptions  Pending Prescriptions Disp Refills   amLODipine (NORVASC) 10 MG tablet 90 tablet 0    Sig: Take 1 tablet (10 mg total) by mouth daily.     Cardiovascular: Calcium Channel Blockers 2 Passed - 05/18/2023  1:12 PM      Passed - Last BP in normal range    BP Readings from Last 1 Encounters:  02/03/23 132/84         Passed - Last Heart Rate in normal range    Pulse Readings from Last 1 Encounters:  02/03/23 66         Passed - Valid encounter within last 6 months    Recent Outpatient Visits           3 months ago Pure hypercholesterolemia   [place] [place], Salvadore Oxford, NP   8 months ago Allergic conjunctivitis of both eyes   [place] [facility] [place], Salvadore Oxford, NP   9 months ago Encounter for general adult medical examination with abnormal findings   [place] [facility] [place], Salvadore Oxford, NP   1 year ago Pure hypercholesterolemia   [facility] [place], Salvadore Oxford, NP   1 year ago Nausea and vomiting, unspecified vomiting type   [place] [facility] [place], Salvadore Oxford, NP       Future Appointments             In 2 months Baity, Salvadore Oxford, NP  [facility], [facility]

## 2023-07-14 ENCOUNTER — Other Ambulatory Visit: Payer: Self-pay | Admitting: Internal Medicine

## 2023-07-14 DIAGNOSIS — E785 Hyperlipidemia, unspecified: Secondary | ICD-10-CM

## 2023-07-15 NOTE — Telephone Encounter (Signed)
 Requested Prescriptions  Pending Prescriptions Disp Refills   atorvastatin  (LIPITOR) 20 MG tablet [Pharmacy Med Name: ATORVASTATIN  20 MG TABLET] 90 tablet 0    Sig: TAKE 1 TABLET BY MOUTH EVERY DAY     Cardiovascular:  Antilipid - Statins Failed - 07/15/2023  3:15 PM      Failed - Lipid Panel in normal range within the last 12 months    Cholesterol, Total  Date Value Ref Range Status  12/21/2014 124 100 - 199 mg/dL Final   Cholesterol  Date Value Ref Range Status  02/03/2023 128 <200 mg/dL Final   LDL Cholesterol (Calc)  Date Value Ref Range Status  02/03/2023 62 mg/dL (calc) Final    Comment:    Reference range: <100 . Desirable range <100 mg/dL for primary prevention;   <70 mg/dL for patients with CHD or diabetic patients  with > or = 2 CHD risk factors. SABRA LDL-C is now calculated using the Martin-Hopkins  calculation, which is a validated novel method providing  better accuracy than the Friedewald equation in the  estimation of LDL-C.  Gladis APPLETHWAITE et al. SANDREA. 7986;689(80): 2061-2068  (http://education.[facility].com/faq/FAQ164)    HDL  Date Value Ref Range Status  02/03/2023 49 (L) > OR = 50 mg/dL Final  92/84/7983 40 >60 mg/dL Final    Comment:    According to ATP-III Guidelines, HDL-C >59 mg/dL is considered a negative risk factor for CHD.    Triglycerides  Date Value Ref Range Status  02/03/2023 86 <150 mg/dL Final         Passed - Patient is not pregnant      Passed - Valid encounter within last 12 months    Recent Outpatient Visits           5 months ago Pure hypercholesterolemia   [facility] [place], Angeline ORN, NP   10 months ago Allergic conjunctivitis of both eyes   [place] [facility] [place], Angeline ORN, NP   11 months ago Encounter for general adult medical examination with abnormal findings   [facility] [place], Angeline ORN, NP   1 year ago Pure hypercholesterolemia   [facility] [place], Angeline ORN, NP   1 year ago Nausea and vomiting, unspecified vomiting type   [place] [facility] [place], Angeline ORN, NP       Future Appointments             In 3 weeks [place], Angeline ORN, NP [place] [facility], [facility]

## 2023-08-10 ENCOUNTER — Ambulatory Visit (INDEPENDENT_AMBULATORY_CARE_PROVIDER_SITE_OTHER): Payer: Medicare HMO | Admitting: Internal Medicine

## 2023-08-10 ENCOUNTER — Encounter: Payer: Self-pay | Admitting: Internal Medicine

## 2023-08-10 VITALS — BP 134/84 | Ht 66.0 in | Wt 201.8 lb

## 2023-08-10 DIAGNOSIS — Z1231 Encounter for screening mammogram for malignant neoplasm of breast: Secondary | ICD-10-CM

## 2023-08-10 DIAGNOSIS — E7800 Pure hypercholesterolemia, unspecified: Secondary | ICD-10-CM

## 2023-08-10 DIAGNOSIS — R7303 Prediabetes: Secondary | ICD-10-CM

## 2023-08-10 DIAGNOSIS — Z0001 Encounter for general adult medical examination with abnormal findings: Secondary | ICD-10-CM

## 2023-08-10 DIAGNOSIS — N184 Chronic kidney disease, stage 4 (severe): Secondary | ICD-10-CM

## 2023-08-10 NOTE — Patient Instructions (Signed)
 Health Maintenance for Postmenopausal Women Menopause is a normal process in which your ability to get pregnant comes to an end. This process happens slowly over many months or years, usually between the ages of 24 and 62. Menopause is complete when you have missed your menstrual period for 12 months. It is important to talk with your health care provider about some of the most common conditions that affect women after menopause (postmenopausal women). These include heart disease, cancer, and bone loss (osteoporosis). Adopting a healthy lifestyle and getting preventive care can help to promote your health and wellness. The actions you take can also lower your chances of developing some of these common conditions. What are the signs and symptoms of menopause? During menopause, you may have the following symptoms: Hot flashes. These can be moderate or severe. Night sweats. Decrease in sex drive. Mood swings. Headaches. Tiredness (fatigue). Irritability. Memory problems. Problems falling asleep or staying asleep. Talk with your health care provider about treatment options for your symptoms. Do I need hormone replacement therapy? Hormone replacement therapy is effective in treating symptoms that are caused by menopause, such as hot flashes and night sweats. Hormone replacement carries certain risks, especially as you become older. If you are thinking about using estrogen or estrogen with progestin, discuss the benefits and risks with your health care provider. How can I reduce my risk for heart disease and stroke? The risk of heart disease, heart attack, and stroke increases as you age. One of the causes may be a change in the body's hormones during menopause. This can affect how your body uses dietary fats, triglycerides, and cholesterol. Heart attack and stroke are medical emergencies. There are many things that you can do to help prevent heart disease and stroke. Watch your blood pressure High  blood pressure causes heart disease and increases the risk of stroke. This is more likely to develop in people who have high blood pressure readings or are overweight. Have your blood pressure checked: Every 3-5 years if you are 50-75 years of age. Every year if you are 77 years old or older. Eat a healthy diet  Eat a diet that includes plenty of vegetables, fruits, low-fat dairy products, and lean protein. Do not eat a lot of foods that are high in solid fats, added sugars, or sodium. Get regular exercise Get regular exercise. This is one of the most important things you can do for your health. Most adults should: Try to exercise for at least 150 minutes each week. The exercise should increase your heart rate and make you sweat (moderate-intensity exercise). Try to do strengthening exercises at least twice each week. Do these in addition to the moderate-intensity exercise. Spend less time sitting. Even light physical activity can be beneficial. Other tips Work with your health care provider to achieve or maintain a healthy weight. Do not use any products that contain nicotine or tobacco. These products include cigarettes, chewing tobacco, and vaping devices, such as e-cigarettes. If you need help quitting, ask your health care provider. Know your numbers. Ask your health care provider to check your cholesterol and your blood sugar (glucose). Continue to have your blood tested as directed by your health care provider. Do I need screening for cancer? Depending on your health history and family history, you may need to have cancer screenings at different stages of your life. This may include screening for: Breast cancer. Cervical cancer. Lung cancer. Colorectal cancer. What is my risk for osteoporosis? After menopause, you may be  at increased risk for osteoporosis. Osteoporosis is a condition in which bone destruction happens more quickly than new bone creation. To help prevent osteoporosis or  the bone fractures that can happen because of osteoporosis, you may take the following actions: If you are 61-3 years old, get at least 1,000 mg of calcium and at least 600 international units (IU) of vitamin D per day. If you are older than age 61 but younger than age 75, get at least 1,200 mg of calcium and at least 600 international units (IU) of vitamin D per day. If you are older than age 62, get at least 1,200 mg of calcium and at least 800 international units (IU) of vitamin D per day. Smoking and drinking excessive alcohol increase the risk of osteoporosis. Eat foods that are rich in calcium and vitamin D, and do weight-bearing exercises several times each week as directed by your health care provider. How does menopause affect my mental health? Depression may occur at any age, but it is more common as you become older. Common symptoms of depression include: Feeling depressed. Changes in sleep patterns. Changes in appetite or eating patterns. Feeling an overall lack of motivation or enjoyment of activities that you previously enjoyed. Frequent crying spells. Talk with your health care provider if you think that you are experiencing any of these symptoms. General instructions See your health care provider for regular wellness exams and vaccines. This may include: Scheduling regular health, dental, and eye exams. Getting and maintaining your vaccines. These include: Influenza vaccine. Get this vaccine each year before the flu season begins. Pneumonia vaccine. Shingles vaccine. Tetanus, diphtheria, and pertussis (Tdap) booster vaccine. Your health care provider may also recommend other immunizations. Tell your health care provider if you have ever been abused or do not feel safe at home. Summary Menopause is a normal process in which your ability to get pregnant comes to an end. This condition causes hot flashes, night sweats, decreased interest in sex, mood swings, headaches, or lack  of sleep. Treatment for this condition may include hormone replacement therapy. Take actions to keep yourself healthy, including exercising regularly, eating a healthy diet, watching your weight, and checking your blood pressure and blood sugar levels. Get screened for cancer and depression. Make sure that you are up to date with all your vaccines. This information is not intended to replace advice given to you by your health care provider. Make sure you discuss any questions you have with your health care provider. Document Revised: 10/14/2020 Document Reviewed: 10/14/2020 Elsevier Patient Education  2024 [employer].

## 2023-08-10 NOTE — Progress Notes (Signed)
 Subjective:    Patient ID: Vanessa Camacho, female    DOB: 12/11/1955, 68 y.o.   MRN: [id number]  HPI  Patient presents to clinic today for annual exam.  Flu: 01/2023 Tetanus: 01/2020 COVID: Moderna x 2 Pneumovax: 12/2017 Prevnar: 03/2021 Shingrix: Never Pap smear: 01/2020 Mammogram: 10/2022 Bone density: 10/2022 Colon screening: never Vision screening: as needed Dentist: as needed  Diet: She does eat meat. She consumes fruits and veggies. She does eat some fried foods. She drinks mostly water, juice, tea. Exercise: dancing  Review of Systems     Past Medical History:  Diagnosis Date   Allergy    Anxiety    Arthritis    Chronic kidney disease    stage 4 renal failure past urologist was in [place]   Depression    Hypertension    Stroke [facility])    Urinary incontinence     Current Outpatient Medications  Medication Sig Dispense Refill   amLODipine (NORVASC) 10 MG tablet Take 1 tablet (10 mg total) by mouth daily. 90 tablet 0   aspirin 81 MG chewable tablet Chew by mouth daily.     atorvastatin (LIPITOR) 20 MG tablet TAKE 1 TABLET BY MOUTH EVERY DAY 90 tablet 0   busPIRone (BUSPAR) 5 MG tablet Take 5 mg by mouth 2 (two) times daily as needed.     calcitRIOL (ROCALTROL) 0.25 MCG capsule Take 0.25 mcg by mouth every other day. (Patient not taking: Reported on 12/24/2022)     citalopram (CELEXA) 20 MG tablet TAKE 1 TABLET BY MOUTH EVERY DAY 90 tablet 1   diphenhydrAMINE (BENADRYL) 25 mg capsule Take 25 mg by mouth at bedtime as needed.     hydrOXYzine (ATARAX) 10 MG tablet Take 1 tablet (10 mg total) by mouth 3 (three) times daily as needed. 30 tablet 1   loratadine (CLARITIN) 10 MG tablet TAKE 1 TABLET BY MOUTH EVERY DAY 90 tablet 0   metoprolol succinate (TOPROL-XL) 25 MG 24 hr tablet Take 1 tablet (25 mg total) by mouth daily. 90 tablet 1   Olopatadine HCl 0.2 % SOLN Apply 1 drop to eye daily. 2.5 mL 0   omeprazole (PRILOSEC) 20 MG capsule TAKE 1 CAPSULE (20 MG TOTAL) BY  MOUTH DAILY. 90 capsule 1   ondansetron (ZOFRAN-ODT) 8 MG disintegrating tablet Take 1 tablet (8 mg total) by mouth every 8 (eight) hours as needed for nausea or vomiting. 20 tablet 0   No current facility-administered medications for this visit.    No Known Allergies  Family History  Problem Relation Age of Onset   Stroke Mother    Dementia Mother    Hypertension Mother    Cancer Father    Brain cancer Father    Skin cancer Sister     Social History   Socioeconomic History   Marital status: Married    Spouse name: Not on file   Number of children: Not on file   Years of education: Not on file   Highest education level: Not on file  Occupational History   Not on file  Tobacco Use   Smoking status: Former    Current packs/day: 0.00    Average packs/day: 0.3 packs/day for 40.0 years (10.0 ttl pk-yrs)    Types: Cigarettes    Start date: 01/01/1975    Quit date: 01/01/2015    Years since quitting: 8.6   Smokeless tobacco: Never  Vaping Use   Vaping status: Never Used  Substance and Sexual Activity  Alcohol use: Not Currently    Alcohol/week: 0.0 standard drinks of alcohol    Comment: occassional   Drug use: Yes    Frequency: 3.0 times per week    Types: Marijuana    Comment: enjoyment   Sexual activity: Not Currently  Other Topics Concern   Not on file  Social History Narrative   Not on file   Social Drivers of Health   Financial Resource Strain: Low Risk  (12/24/2022)   Overall Financial Resource Strain (CARDIA)    Difficulty of Paying Living Expenses: Not hard at all  Food Insecurity: No Food Insecurity (12/24/2022)   Hunger Vital Sign    Worried About Running Out of Food in the Last Year: Never true    Ran Out of Food in the Last Year: Never true  Transportation Needs: No Transportation Needs (12/24/2022)   PRAPARE - Administrator, Civil Service (Medical): No    Lack of Transportation (Non-Medical): No  Physical Activity: Inactive (12/24/2022)    Exercise Vital Sign    Days of Exercise per Week: 0 days    Minutes of Exercise per Session: 0 min  Stress: No Stress Concern Present (12/24/2022)   [employer] of Occupational Health - Occupational Stress Questionnaire    Feeling of Stress : Not at all  Social Connections: Socially Isolated (12/24/2022)   Social Connection and Isolation Panel [NHANES]    Frequency of Communication with Friends and Family: More than three times a week    Frequency of Social Gatherings with Friends and Family: More than three times a week    Attends Religious Services: Never    Database administrator or Organizations: No    Attends Banker Meetings: Never    Marital Status: Divorced  Catering manager Violence: Not At Risk (12/24/2022)   Humiliation, Afraid, Rape, and Kick questionnaire    Fear of Current or Ex-Partner: No    Emotionally Abused: No    Physically Abused: No    Sexually Abused: No     Constitutional: Denies fever, malaise, fatigue, headache or abrupt weight changes.  HEENT: Denies eye pain, eye redness, ear pain, ringing in the ears, wax buildup, runny nose, nasal congestion, bloody nose, or sore throat. Respiratory: Denies difficulty breathing, shortness of breath, cough or sputum production.   Cardiovascular: Denies chest pain, chest tightness, palpitations or swelling in the hands or feet.  Gastrointestinal: Denies abdominal pain, bloating, constipation, diarrhea or blood in the stool.  GU: Pt reports urinary frequency. Denies urgency, pain with urination, burning sensation, blood in urine, odor or discharge. Musculoskeletal: Denies decrease in range of motion, difficulty with gait, muscle pain or joint pain and swelling.  Skin: Denies redness, rashes, lesions or ulcercations.  Neurological: Denies dizziness, difficulty with memory, difficulty with speech or problems with balance and coordination.  Psych: Patient has a history of anxiety and depression.  Denies  SI/HI.  No other specific complaints in a complete review of systems (except as listed in HPI above).  Objective:   Physical Exam  BP 134/84 (BP Location: Left Arm, Patient Position: Sitting, Cuff Size: Normal)   Ht 5\' 6"  (1.676 m)   Wt 201 lb 12.8 oz (91.5 kg)   BMI 32.57 kg/m    Wt Readings from Last 3 Encounters:  02/03/23 196 lb (88.9 kg)  12/24/22 195 lb (88.5 kg)  08/31/22 186 lb (84.4 kg)    General: Appears her stated age, obese, in NAD. Skin: Warm, dry and intact.  No ulcerations noted. HEENT: Head: normal shape and size; Eyes: sclera white, no icterus, conjunctiva pink, PERRLA and EOMs intact;  Neck:  Neck supple, trachea midline. No masses, lumps or thyromegaly present.  Cardiovascular: Normal rate and rhythm. S1,S2 noted.  No murmur, rubs or gallops noted. No JVD or BLE edema. No carotid bruits noted. Pulmonary/Chest: Normal effort and diminished breath sounds. No respiratory distress. No wheezes, rales or ronchi noted.  Abdomen: Normal bowel sounds.  Musculoskeletal: Strength 5/5 BUE/BLE. No difficulty with gait.  Neurological: Alert and oriented. Cranial nerves II-XII grossly intact. Coordination normal.  Psychiatric: Mood and affect normal. Behavior is normal. Judgment and thought content normal.     BMET    Component Value Date/Time   NA 140 02/03/2023 0900   NA 144 12/21/2014 0907   NA 143 04/11/2014 1344   K 4.7 02/03/2023 0900   K 4.8 04/11/2014 1344   CL 107 02/03/2023 0900   CL 108 (H) 04/11/2014 1344   CO2 24 02/03/2023 0900   CO2 27 04/11/2014 1344   GLUCOSE 133 (H) 02/03/2023 0900   GLUCOSE 132 (H) 04/11/2014 1344   BUN 55 (H) 02/03/2023 0900   BUN 30 (H) 12/21/2014 0907   BUN 38 (H) 04/11/2014 1344   CREATININE 3.70 (H) 02/03/2023 0900   CALCIUM 9.6 02/03/2023 0900   CALCIUM 8.7 04/11/2014 1344   GFRNONAA 19 (L) 01/25/2020 0941   GFRAA 22 (L) 01/25/2020 0941    Lipid Panel     Component Value Date/Time   CHOL 128 02/03/2023 0900    CHOL 124 12/21/2014 0907   TRIG 86 02/03/2023 0900   HDL 49 (L) 02/03/2023 0900   HDL 40 12/21/2014 0907   CHOLHDL 2.6 02/03/2023 0900   LDLCALC 62 02/03/2023 0900    CBC    Component Value Date/Time   WBC 10.9 (H) 02/03/2023 0900   RBC 4.52 02/03/2023 0900   HGB 13.6 02/03/2023 0900   HCT 41.9 02/03/2023 0900   PLT 191 02/03/2023 0900   MCV 92.7 02/03/2023 0900   MCH 30.1 02/03/2023 0900   MCHC 32.5 02/03/2023 0900   RDW 11.9 02/03/2023 0900   LYMPHSABS 1,997 01/25/2020 0941   MONOABS 0.7 08/03/2019 1049   EOSABS 179 01/25/2020 0941   BASOSABS 31 01/25/2020 0941    Hgb A1C Lab Results  Component Value Date   HGBA1C 6.1 (H) 02/03/2023            Assessment & Plan:   Preventative Health Maintenance:  Flu shot UTD Tetanus UTD Encouraged her to get her COVID booster Pneumovax and Prevnar UTD Discussed Shingrix vaccine, she will check coverage with her insurance company and schedule a visit if she would like to have this done She no longer needs to screen for cervical cancer given her age Mammogram ordered-she will call to schedule Bone density UTD She declines colon cancer screening at this time including Cologuard Encouraged her to consume a balanced diet and exercise regimen Advised her to see an eye doctor and dentist annually We will check CBC, c-Met, lipid, A1c today  RTC in 6 months, follow-up chronic conditions Nicki Reaper, NP

## 2023-08-11 LAB — CBC
HCT: 40.5 % (ref 35.0–45.0)
Hemoglobin: 13 g/dL (ref 11.7–15.5)
MCH: 29.8 pg (ref 27.0–33.0)
MCHC: 32.1 g/dL (ref 32.0–36.0)
MCV: 92.9 fL (ref 80.0–100.0)
MPV: 12.8 fL — ABNORMAL HIGH (ref 7.5–12.5)
Platelets: 155 10*3/uL (ref 140–400)
RBC: 4.36 10*6/uL (ref 3.80–5.10)
RDW: 12.2 % (ref 11.0–15.0)
WBC: 9.4 10*3/uL (ref 3.8–10.8)

## 2023-08-11 LAB — HEMOGLOBIN A1C
Hgb A1c MFr Bld: 5.9 %{Hb} — ABNORMAL HIGH (ref <5.7)
Mean Plasma Glucose: 123 mg/dL
eAG (mmol/L): 6.8 mmol/L

## 2023-08-11 LAB — COMPLETE METABOLIC PANEL WITH GFR
AG Ratio: 1.8 (calc) (ref 1.0–2.5)
ALT: 7 U/L (ref 6–29)
AST: 12 U/L (ref 10–35)
Albumin: 4.4 g/dL (ref 3.6–5.1)
Alkaline phosphatase (APISO): 123 U/L (ref 37–153)
BUN/Creatinine Ratio: 14 (calc) (ref 6–22)
BUN: 41 mg/dL — ABNORMAL HIGH (ref 7–25)
CO2: 25 mmol/L (ref 20–32)
Calcium: 9.1 mg/dL (ref 8.6–10.4)
Chloride: 109 mmol/L (ref 98–110)
Creat: 3.01 mg/dL — ABNORMAL HIGH (ref 0.50–1.05)
Globulin: 2.4 g/dL (ref 1.9–3.7)
Glucose, Bld: 119 mg/dL — ABNORMAL HIGH (ref 65–99)
Potassium: 4.9 mmol/L (ref 3.5–5.3)
Sodium: 141 mmol/L (ref 135–146)
Total Bilirubin: 0.4 mg/dL (ref 0.2–1.2)
Total Protein: 6.8 g/dL (ref 6.1–8.1)
eGFR: 16 mL/min/{1.73_m2} — ABNORMAL LOW (ref >=60)

## 2023-08-11 LAB — LIPID PANEL
Cholesterol: 133 mg/dL (ref <200)
HDL: 54 mg/dL (ref >=50)
LDL Cholesterol (Calc): 63 mg/dL
Non-HDL Cholesterol (Calc): 79 mg/dL (ref <130)
Total CHOL/HDL Ratio: 2.5 (calc) (ref <5.0)
Triglycerides: 81 mg/dL (ref <150)

## 2023-08-11 NOTE — Addendum Note (Signed)
 Addended by: Lorre Munroe on: 08/11/2023 09:14 AM   Modules accepted: Orders

## 2023-08-17 ENCOUNTER — Other Ambulatory Visit: Payer: Self-pay | Admitting: Internal Medicine

## 2023-08-17 DIAGNOSIS — E785 Hyperlipidemia, unspecified: Secondary | ICD-10-CM

## 2023-08-17 DIAGNOSIS — N184 Chronic kidney disease, stage 4 (severe): Secondary | ICD-10-CM

## 2023-08-17 DIAGNOSIS — I10 Essential (primary) hypertension: Secondary | ICD-10-CM

## 2023-08-17 DIAGNOSIS — F418 Other specified anxiety disorders: Secondary | ICD-10-CM

## 2023-08-18 NOTE — Telephone Encounter (Signed)
 Requested Prescriptions  Pending Prescriptions Disp Refills   metoprolol succinate (TOPROL-XL) 25 MG 24 hr tablet [Pharmacy Med Name: METOPROLOL SUCC ER 25 MG TAB] 90 tablet 1    Sig: TAKE 1 TABLET (25 MG TOTAL) BY MOUTH DAILY.     Cardiovascular:  Beta Blockers Failed - 08/18/2023  9:10 AM      Failed - Valid encounter within last 6 months    Recent Outpatient Visits           6 months ago Pure hypercholesterolemia   [place] [facility] [place], Salvadore [place], NP   11 months ago Allergic conjunctivitis of both eyes   [place] [facility] [place], Salvadore Oxford, NP   1 year ago Encounter for general adult medical examination with abnormal findings   [place] [facility] [place], Salvadore Oxford, NP   1 year ago Pure hypercholesterolemia   [place] [facility] [place], Salvadore Oxford, NP   1 year ago Nausea and vomiting, unspecified vomiting type   [place] [facility] [place], [place] W, NP              Passed - Last BP in normal range    BP Readings from Last 1 Encounters:  08/10/23 134/84         Passed - Last Heart Rate in normal range    Pulse Readings from Last 1 Encounters:  02/03/23 66          atorvastatin (LIPITOR) 20 MG tablet [Pharmacy Med Name: ATORVASTATIN 20 MG TABLET] 90 tablet 0    Sig: TAKE 1 TABLET BY MOUTH EVERY DAY     Cardiovascular:  Antilipid - Statins Failed - 08/18/2023  9:10 AM      Failed - Lipid Panel in normal range within the last 12 months    Cholesterol, Total  Date Value Ref Range Status  12/21/2014 124 100 - 199 mg/dL Final   Cholesterol  Date Value Ref Range Status  08/10/2023 133 <200 mg/dL Final   LDL Cholesterol (Calc)  Date Value Ref Range Status  08/10/2023 63 mg/dL (calc) Final    Comment:    Reference range: <100 . Desirable range <100 mg/dL for primary prevention;   <70 mg/dL for patients with CHD or diabetic patients  with > or = 2 CHD risk  factors. Marland Kitchen LDL-C is now calculated using the Martin-Hopkins  calculation, which is a validated novel method providing  better accuracy than the Friedewald equation in the  estimation of LDL-C.  Horald Pollen et al. Lenox Ahr. 1610;960(45): 2061-2068  (http://education.[facility].com/faq/FAQ164)    HDL  Date Value Ref Range Status  08/10/2023 54 > OR = 50 mg/dL Final  40/98/1191 40 >47 mg/dL Final    Comment:    According to ATP-III Guidelines, HDL-C >59 mg/dL is considered a negative risk factor for CHD.    Triglycerides  Date Value Ref Range Status  08/10/2023 81 <150 mg/dL Final         Passed - Patient is not pregnant      Passed - Valid encounter within last 12 months    Recent Outpatient Visits           6 months ago Pure hypercholesterolemia   [place] [facility] [place], Salvadore Oxford, NP   11 months ago Allergic conjunctivitis of both eyes    [facility] [place], Salvadore Oxford, NP   1 year ago Encounter  for general adult medical examination with abnormal findings   [place] [facility] [place], Salvadore Oxford, NP   1 year ago Pure hypercholesterolemia   [place] [facility] [place], [place], NP   1 year ago Nausea and vomiting, unspecified vomiting type   [place] [facility], Salvadore Oxford, NP               amLODipine (NORVASC) 10 MG tablet [Pharmacy Med Name: AMLODIPINE BESYLATE 10 MG TAB] 90 tablet 1    Sig: TAKE 1 TABLET BY MOUTH EVERY DAY     Cardiovascular: Calcium Channel Blockers 2 Failed - 08/18/2023  9:10 AM      Failed - Valid encounter within last 6 months    Recent Outpatient Visits           6 months ago Pure hypercholesterolemia   [place] [facility] [place], Salvadore Oxford, NP   11 months ago Allergic conjunctivitis of both eyes   [place] [facility] [place], Salvadore Oxford, NP   1 year ago Encounter for general adult medical  examination with abnormal findings   [place] [facility] [place], Salvadore Oxford, NP   1 year ago Pure hypercholesterolemia   [place] [facility] [place], [place], NP   1 year ago Nausea and vomiting, unspecified vomiting type   [facility] [place], [place], NP              Passed - Last BP in normal range    BP Readings from Last 1 Encounters:  08/10/23 134/84         Passed - Last Heart Rate in normal range    Pulse Readings from Last 1 Encounters:  02/03/23 66          citalopram (CELEXA) 20 MG tablet [Pharmacy Med Name: CITALOPRAM HBR 20 MG TABLET] 90 tablet 1    Sig: TAKE 1 TABLET BY MOUTH EVERY DAY     Psychiatry:  Antidepressants - SSRI Failed - 08/18/2023  9:10 AM      Failed - Valid encounter within last 6 months    Recent Outpatient Visits           6 months ago Pure hypercholesterolemia   [place] [facility] [place], Salvadore Oxford, NP   11 months ago Allergic conjunctivitis of both eyes   [place] [facility] [place], Salvadore Oxford, NP   1 year ago Encounter for general adult medical examination with abnormal findings   [place] [facility] [place], Salvadore Oxford, NP   1 year ago Pure hypercholesterolemia   [place] [facility] [place], Salvadore Oxford, NP   1 year ago Nausea and vomiting, unspecified vomiting type   [place] [facility] [place], [place], [place]              Passed - Completed PHQ-2 or PHQ-9 in the last 360 days

## 2023-09-01 DIAGNOSIS — R8290 Unspecified abnormal findings in urine: Secondary | ICD-10-CM | POA: Diagnosis not present

## 2023-09-01 DIAGNOSIS — N2581 Secondary hyperparathyroidism of renal origin: Secondary | ICD-10-CM | POA: Diagnosis not present

## 2023-09-01 DIAGNOSIS — I129 Hypertensive chronic kidney disease with stage 1 through stage 4 chronic kidney disease, or unspecified chronic kidney disease: Secondary | ICD-10-CM | POA: Diagnosis not present

## 2023-09-01 DIAGNOSIS — N184 Chronic kidney disease, stage 4 (severe): Secondary | ICD-10-CM | POA: Diagnosis not present

## 2023-09-01 DIAGNOSIS — R809 Proteinuria, unspecified: Secondary | ICD-10-CM | POA: Diagnosis not present

## 2023-09-01 DIAGNOSIS — E875 Hyperkalemia: Secondary | ICD-10-CM | POA: Diagnosis not present

## 2023-11-10 ENCOUNTER — Other Ambulatory Visit: Payer: Self-pay | Admitting: Internal Medicine

## 2023-11-10 DIAGNOSIS — E785 Hyperlipidemia, unspecified: Secondary | ICD-10-CM

## 2023-11-10 DIAGNOSIS — J302 Other seasonal allergic rhinitis: Secondary | ICD-10-CM

## 2023-11-11 NOTE — Telephone Encounter (Signed)
 Requested Prescriptions  Pending Prescriptions Disp Refills   omeprazole  (PRILOSEC) 20 MG capsule [Pharmacy Med Name: OMEPRAZOLE  DR 20 MG CAPSULE] 90 capsule 2    Sig: TAKE 1 CAPSULE (20 MG TOTAL) BY MOUTH DAILY.     Gastroenterology: Proton Pump Inhibitors Passed - 11/11/2023  1:30 PM      Passed - Valid encounter within last 12 months    Recent Outpatient Visits           3 months ago Encounter for screening mammogram for breast cancer   [place] [facility] [place], [place], NP               loratadine  (CLARITIN ) 10 MG tablet [Pharmacy Med Name: LORATADINE  10 MG TABLET] 90 tablet 2    Sig: TAKE 1 TABLET BY MOUTH EVERY DAY     Ear, Nose, and Throat:  Antihistamines 2 Failed - 11/11/2023  1:30 PM      Failed - Cr in normal range and within 360 days    Creat  Date Value Ref Range Status  08/10/2023 3.01 (H) 0.50 - 1.05 mg/dL Final         Passed - Valid encounter within last 12 months    Recent Outpatient Visits           3 months ago Encounter for screening mammogram for breast cancer   [place] [facility] [employer] [place], [place], NP               atorvastatin  (LIPITOR) 20 MG tablet [Pharmacy Med Name: ATORVASTATIN  20 MG TABLET] 90 tablet 2    Sig: TAKE 1 TABLET BY MOUTH EVERY DAY     Cardiovascular:  Antilipid - Statins Failed - 11/11/2023  1:30 PM      Failed - Lipid Panel in normal range within the last 12 months    Cholesterol, Total  Date Value Ref Range Status  12/21/2014 124 100 - 199 mg/dL Final   Cholesterol  Date Value Ref Range Status  08/10/2023 133 <200 mg/dL Final   LDL Cholesterol (Calc)  Date Value Ref Range Status  08/10/2023 63 mg/dL (calc) Final    Comment:    Reference range: <100 . Desirable range <100 mg/dL for primary prevention;   <70 mg/dL for patients with CHD or diabetic patients  with > or = 2 CHD risk factors. Aaron Aas LDL-C is now calculated using the Martin-Hopkins  calculation, which is a validated  novel method providing  better accuracy than the Friedewald equation in the  estimation of LDL-C.  Melinda Sprawls et al. Erroll Heard. 1191;478(29): 2061-2068  (http://education.[facility].com/faq/FAQ164)    HDL  Date Value Ref Range Status  08/10/2023 54 > OR = 50 mg/dL Final  56/21/3086 40 >57 mg/dL Final    Comment:    According to ATP-III Guidelines, HDL-C >59 mg/dL is considered a negative risk factor for CHD.    Triglycerides  Date Value Ref Range Status  08/10/2023 81 <150 mg/dL Final         Passed - Patient is not pregnant      Passed - Valid encounter within last 12 months    Recent Outpatient Visits           3 months ago Encounter for screening mammogram for breast cancer   [place] [facility] [place], [place] Buzz[place], [place]

## 2023-11-17 ENCOUNTER — Other Ambulatory Visit: Payer: Self-pay | Admitting: Internal Medicine

## 2023-11-18 NOTE — Telephone Encounter (Signed)
 Refilled 11/11/23, duplicate request.  Requested Prescriptions  Pending Prescriptions Disp Refills   omeprazole  (PRILOSEC) 20 MG capsule [Pharmacy Med Name: OMEPRAZOLE  DR 20 MG CAPSULE] 90 capsule 1    Sig: TAKE 1 CAPSULE (20 MG TOTAL) BY MOUTH DAILY.     Gastroenterology: Proton Pump Inhibitors Passed - 11/18/2023  2:03 PM      Passed - Valid encounter within last 12 months    Recent Outpatient Visits           3 months ago Encounter for screening mammogram for breast cancer   [place] [facility] [place], [place] Buzz[place], [place]

## 2023-12-07 DIAGNOSIS — I129 Hypertensive chronic kidney disease with stage 1 through stage 4 chronic kidney disease, or unspecified chronic kidney disease: Secondary | ICD-10-CM | POA: Diagnosis not present

## 2023-12-07 DIAGNOSIS — N184 Chronic kidney disease, stage 4 (severe): Secondary | ICD-10-CM | POA: Diagnosis not present

## 2023-12-07 DIAGNOSIS — R809 Proteinuria, unspecified: Secondary | ICD-10-CM | POA: Diagnosis not present

## 2023-12-07 DIAGNOSIS — N2581 Secondary hyperparathyroidism of renal origin: Secondary | ICD-10-CM | POA: Diagnosis not present

## 2023-12-07 DIAGNOSIS — E875 Hyperkalemia: Secondary | ICD-10-CM | POA: Diagnosis not present

## 2023-12-30 ENCOUNTER — Ambulatory Visit: Payer: Medicare HMO

## 2023-12-30 VITALS — Ht 66.0 in | Wt 201.0 lb

## 2023-12-30 DIAGNOSIS — Z0000 Encounter for general adult medical examination without abnormal findings: Secondary | ICD-10-CM

## 2023-12-30 NOTE — Patient Instructions (Addendum)
 Ms. Metheny , Thank you for taking time out of your busy schedule to complete your Annual Wellness Visit with me. I enjoyed our conversation and look forward to speaking with you again next year. I, as well as your care team,  appreciate your ongoing commitment to your health goals. Please review the following plan we discussed and let me know if I can assist you in the future. Your Game plan/ To Do List    Referrals: If you haven't heard from the office you've been referred to, please reach out to them at the phone provided.   Follow up Visits: Next Medicare AWV with our clinical staff: 01/05/25 @ 11:30a   Have you seen your provider in the last 6 months (3 months if uncontrolled diabetes)?  Next Office Visit with your provider: 02/10/24 @ 8:20a  Clinician Recommendations:  Aim for 30 minutes of exercise or brisk walking, 6-8 glasses of water, and 5 servings of fruits and vegetables each day.       This is a list of the screening recommended for you and due dates:  Health Maintenance  Topic Date Due   Zoster (Shingles) Vaccine (1 of 2) Never done   Cologuard (Stool DNA test)  08/09/2024*   Flu Shot  01/07/2024   Mammogram  10/25/2024   Medicare Annual Wellness Visit  12/29/2024   DTaP/Tdap/Td vaccine (2 - Td or Tdap) 01/24/2030   Pneumococcal Vaccine for age over 26  Completed   DEXA scan (bone density measurement)  Completed   Hepatitis C Screening  Completed   Hepatitis B Vaccine  Aged Out   HPV Vaccine  Aged Out   Meningitis B Vaccine  Aged Out   COVID-19 Vaccine  Discontinued  *Topic was postponed. The date shown is not the original due date.    Advanced directives: (Declined) Advance directive discussed with you today. Even though you declined this today, please call our office should you change your mind, and we can give you the proper paperwork for you to fill out. Advance Care Planning is important because it:  [x]  Makes sure you receive the medical care that is consistent with  your values, goals, and preferences  [x]  It provides guidance to your family and loved ones and reduces their decisional burden about whether or not they are making the right decisions based on your wishes.  Follow the link provided in your after visit summary or read over the paperwork we have mailed to you to help you started getting your Advance Directives in place. If you need assistance in completing these, please reach out to us  so that we can help you!  See attachments for Preventive Care and Fall Prevention Tips.

## 2023-12-30 NOTE — Progress Notes (Signed)
 Subjective:   Vanessa Camacho is a 68 y.o. who presents for a Medicare Wellness preventive visit.  As a reminder, Annual Wellness Visits don't include a physical exam, and some assessments may be limited, especially if this visit is performed virtually. We may recommend an in-person follow-up visit with your provider if needed.  Visit Complete: Virtual I connected with  Vanessa Camacho on 12/30/23 by a audio enabled telemedicine application and verified that I am speaking with the correct person using two identifiers.  Patient Location: Home  Provider Location: Home Office  I discussed the limitations of evaluation and management by telemedicine. The patient expressed understanding and agreed to proceed.  Vital Signs: Because this visit was a virtual/telehealth visit, some criteria may be missing or patient reported. Any vitals not documented were not able to be obtained and vitals that have been documented are patient reported.    Persons Participating in Visit: Patient.  AWV Questionnaire: No: Patient Medicare AWV questionnaire was not completed prior to this visit.  Cardiac Risk Factors include: advanced age (>41men, >60 women);hypertension     Objective:    Today's Vitals   12/30/23 1135  Weight: 201 lb (91.2 kg)  Height: 5' 6 (1.676 m)   Body mass index is 32.44 kg/m.     12/30/2023   11:46 AM 12/24/2022   12:16 PM 01/16/2020   11:47 AM  Advanced Directives  Does Patient Have a Medical Advance Directive? No No No  Would patient like information on creating a medical advance directive? No - Patient declined      Current Medications (verified) Outpatient Encounter Medications as of 12/30/2023  Medication Sig   amLODipine  (NORVASC ) 10 MG tablet TAKE 1 TABLET BY MOUTH EVERY DAY   aspirin 81 MG chewable tablet Chew by mouth daily.   atorvastatin  (LIPITOR) 20 MG tablet TAKE 1 TABLET BY MOUTH EVERY DAY   busPIRone  (BUSPAR ) 5 MG tablet Take 5 mg by mouth 2 (two) times  daily as needed.   citalopram  (CELEXA ) 20 MG tablet TAKE 1 TABLET BY MOUTH EVERY DAY   hydrOXYzine  (ATARAX ) 10 MG tablet Take 1 tablet (10 mg total) by mouth 3 (three) times daily as needed.   loratadine  (CLARITIN ) 10 MG tablet TAKE 1 TABLET BY MOUTH EVERY DAY   metoprolol  succinate (TOPROL -XL) 25 MG 24 hr tablet TAKE 1 TABLET (25 MG TOTAL) BY MOUTH DAILY.   Olopatadine  HCl 0.2 % SOLN Apply 1 drop to eye daily.   omeprazole  (PRILOSEC) 20 MG capsule TAKE 1 CAPSULE (20 MG TOTAL) BY MOUTH DAILY.   ondansetron  (ZOFRAN -ODT) 8 MG disintegrating tablet Take 1 tablet (8 mg total) by mouth every 8 (eight) hours as needed for nausea or vomiting.   oxymetazoline  (AFRIN) 0.05 % nasal spray Place 1 spray into both nostrils 2 (two) times daily.   [DISCONTINUED] fluticasone  (FLONASE ) 50 MCG/ACT nasal spray Place 2 sprays into both nostrils daily. (Patient taking differently: Place 2 sprays into both nostrils as needed. )   No facility-administered encounter medications on file as of 12/30/2023.    Allergies (verified) Patient has no known allergies.   History: Past Medical History:  Diagnosis Date   Allergy     Anxiety    Arthritis    Chronic kidney disease    stage 4 renal failure past urologist was in [place]    Depression    Hypertension    Stroke [facility])    Urinary incontinence    Past Surgical History:  Procedure Laterality Date  CHOLECYSTECTOMY     Family History  Problem Relation Age of Onset   Stroke Mother    Dementia Mother    Hypertension Mother    Cancer Father    Brain cancer Father    Skin cancer Sister    Social History   Socioeconomic History   Marital status: Married    Spouse name: Not on file   Number of children: Not on file   Years of education: Not on file   Highest education level: Not on file  Occupational History   Not on file  Tobacco Use   Smoking status: Former    Current packs/day: 0.00    Average packs/day: 0.3 packs/day for 40.0 years (10.0 ttl  pk-yrs)    Types: Cigarettes    Start date: 01/01/1975    Quit date: 01/01/2015    Years since quitting: 9.0   Smokeless tobacco: Never  Vaping Use   Vaping status: Never Used  Substance and Sexual Activity   Alcohol use: Not Currently    Alcohol/week: 0.0 standard drinks of alcohol    Comment: occassional   Drug use: Yes    Frequency: 3.0 times per week    Types: Marijuana    Comment: enjoyment   Sexual activity: Not Currently  Other Topics Concern   Not on file  Social History Narrative   Not on file   Social Drivers of Health   Financial Resource Strain: Low Risk  (12/30/2023)   Overall Financial Resource Strain (CARDIA)    Difficulty of Paying Living Expenses: Not hard at all  Food Insecurity: No Food Insecurity (12/30/2023)   Hunger Vital Sign    Worried About Running Out of Food in the Last Year: Never true    Ran Out of Food in the Last Year: Never true  Transportation Needs: No Transportation Needs (12/30/2023)   PRAPARE - Administrator, Civil Service (Medical): No    Lack of Transportation (Non-Medical): No  Physical Activity: Inactive (12/30/2023)   Exercise Vital Sign    Days of Exercise per Week: 0 days    Minutes of Exercise per Session: 0 min  Stress: No Stress Concern Present (12/30/2023)   [employer] of Occupational Health - Occupational Stress Questionnaire    Feeling of Stress: Not at all  Social Connections: Socially Isolated (12/30/2023)   Social Connection and Isolation Panel    Frequency of Communication with Friends and Family: More than three times a week    Frequency of Social Gatherings with Friends and Family: More than three times a week    Attends Religious Services: Never    Database administrator or Organizations: No    Attends Engineer, structural: Never    Marital Status: Divorced    Tobacco Counseling Counseling given: Not Answered    Clinical Intake:  Pre-visit preparation completed: Yes  Pain :  No/denies pain     BMI - recorded: 32.44 Nutritional Status: BMI > 30  Obese Nutritional Risks: None Diabetes: No  Lab Results  Component Value Date   HGBA1C 5.9 (H) 08/10/2023   HGBA1C 6.1 (H) 02/03/2023   HGBA1C 5.7 (H) 08/05/2022     How often do you need to have someone help you when you read instructions, pamphlets, or other written materials from your doctor or pharmacy?: 1 - Never  Interpreter Needed?: No  Information entered by :: Rojelio Blush LPN   Activities of Daily Living     12/30/2023   11:43  AM 02/03/2023    9:10 AM  In your present state of health, do you have any difficulty performing the following activities:  Hearing? 0 0  Vision? 0 0  Difficulty concentrating or making decisions? 0 0  Walking or climbing stairs? 1 0  Comment Uses a Cane   Dressing or bathing? 0 0  Doing errands, shopping? 0 0  Preparing Food and eating ? N   Using the Toilet? N   In the past six months, have you accidently leaked urine? Y   Comment Wears Breifs. Followed by medical attention   Do you have problems with loss of bowel control? N   Managing your Medications? N   Managing your Finances? N   Housekeeping or managing your Housekeeping? N     Patient Care Team: Antonette Angeline ORN, NP as PCP - General (Internal Medicine)  I have updated your Care Teams any recent Medical Services you may have received from other providers in the past year.     Assessment:   This is a routine wellness examination for Vanessa Camacho.  Hearing/Vision screen Hearing Screening - Comments:: Denies hearing difficulties   Vision Screening - Comments:: Wears rx glasses - up to date with routine eye exams with  [facility]   Goals Addressed               This Visit's Progress     Increase physical activity (pt-stated)        Lose weight       Depression Screen     12/30/2023   11:43 AM 08/10/2023    8:42 AM 02/03/2023    9:10 AM 12/24/2022   12:21 PM 08/31/2022    2:47 PM 08/05/2022     8:40 AM 02/02/2022   11:16 AM  PHQ 2/9 Scores  PHQ - 2 Score 0 2 0 0 0 0 0  PHQ- 9 Score  11 3 0 0  1    Fall Risk     12/30/2023   11:45 AM 08/10/2023    8:41 AM 02/03/2023    9:10 AM 12/24/2022   12:18 PM 08/31/2022    2:47 PM  Fall Risk   Falls in the past year? 1 1 1  0 0  Number falls in past yr: 0 0 1 0   Injury with Fall? 0 0 0 0 0  Risk for fall due to : No Fall Risks  Other (Comment) Medication side effect   Follow up Falls evaluation completed   Falls prevention discussed;Falls evaluation completed     MEDICARE RISK AT HOME:  Medicare Risk at Home Any stairs in or around the home?: Yes If so, are there any without handrails?: No Home free of loose throw rugs in walkways, pet beds, electrical cords, etc?: Yes Adequate lighting in your home to reduce risk of falls?: Yes Life alert?: No Use of a cane, walker or w/c?: Yes Grab bars in the bathroom?: Yes Shower chair or bench in shower?: No Elevated toilet seat or a handicapped toilet?: No  TIMED UP AND GO:  Was the test performed?  No  Cognitive Function: 6CIT completed        12/30/2023   11:46 AM 12/24/2022   12:22 PM 01/16/2020   11:52 AM  6CIT Screen  What Year? 0 points 0 points 0 points  What month? 0 points 0 points 0 points  What time? 0 points 3 points 0 points  Count back from 20 0 points 0 points 0  points  Months in reverse 0 points 4 points 2 points  Repeat phrase 0 points 2 points 2 points  Total Score 0 points 9 points 4 points    Immunizations Immunization History  Administered Date(s) Administered   Fluad Quad(high Dose 65+) 04/02/2021   Fluad Trivalent(High Dose 65+) 02/03/2023   Influenza,inj,Quad PF,6+ Mos 02/21/2019, 05/06/2020   Moderna Sars-Covid-2 Vaccination 10/19/2019, 11/16/2019   PNEUMOCOCCAL CONJUGATE-20 04/02/2021   Pneumococcal Polysaccharide-23 01/05/2018   Tdap 01/25/2020    Screening Tests Health Maintenance  Topic Date Due   Zoster Vaccines- Shingrix (1 of 2) Never  done   Fecal DNA (Cologuard)  08/09/2024 (Originally 01/01/2001)   INFLUENZA VACCINE  01/07/2024   MAMMOGRAM  10/25/2024   Medicare Annual Wellness (AWV)  12/29/2024   DTaP/Tdap/Td (2 - Td or Tdap) 01/24/2030   Pneumococcal Vaccine: 50+ Years  Completed   DEXA SCAN  Completed   Hepatitis C Screening  Completed   Hepatitis B Vaccines  Aged Out   HPV VACCINES  Aged Out   Meningococcal B Vaccine  Aged Out   COVID-19 Vaccine  Discontinued    Health Maintenance  Health Maintenance Due  Topic Date Due   Zoster Vaccines- Shingrix (1 of 2) Never done   Health Maintenance Items Addressed:   Additional Screening:  Vision Screening: Recommended annual ophthalmology exams for early detection of glaucoma and other disorders of the eye. Would you like a referral to an eye doctor? No    Dental Screening: Recommended annual dental exams for proper oral hygiene  Community Resource Referral / Chronic Care Management: CRR required this visit?  No   CCM required this visit?  No   Plan:    I have personally reviewed and noted the following in the patient's chart:   Medical and social history Use of alcohol, tobacco or illicit drugs  Current medications and supplements including opioid prescriptions. Patient is not currently taking opioid prescriptions. Functional ability and status Nutritional status Physical activity Advanced directives List of other physicians Hospitalizations, surgeries, and ER visits in previous 12 months Vitals Screenings to include cognitive, depression, and falls Referrals and appointments  In addition, I have reviewed and discussed with patient certain preventive protocols, quality metrics, and best practice recommendations. A written personalized care plan for preventive services as well as general preventive health recommendations were provided to patient.   Rojelio LELON Blush, LPN   2/75/7974   After Visit Summary: (MyChart) Due to this being a  telephonic visit, the after visit summary with patients personalized plan was offered to patient via MyChart   Notes: Nothing significant to report at this time.

## 2024-02-10 ENCOUNTER — Ambulatory Visit: Admitting: Internal Medicine

## 2024-02-10 NOTE — Progress Notes (Deleted)
 Subjective:    Patient ID: Vanessa Camacho, female    DOB: [birth year], 68 y.o.   MRN: [id number]  HPI  Patient presents to clinic today for 65-month follow-up of chronic conditions.  Anxiety and depression: Chronic, managed on citalopram , buspirone  and hydroxyzine .  She is not currently seeing a therapist.  She denies SI/HI.  HTN: Her BP today is 132/84.  She is taking amlodipine  and metoprolol  as prescribed.  There is no ECG on file.  HLD status post stroke: No residual effect.  Her last LDL was 63, triglycerides 81, 08/2023.  She denies myalgias on atorvastatin .  She is taking metoprolol  and aspirin as well.  She tries to consume a low-fat diet.  She does not follow with neurology.  CKD 4: Her last creatinine was 3.36, GFR 14, 08/2023.  She is not currently on an ACEI/ARB.  She follows with nephrology.  Secondary hyperparathyroidism: Due to CKD: She is not taking any medication for this but has been prescribed calcitriol in the past.  She follows with nephrology.  GERD: She is not sure what triggers this.  She denies breakthrough on omeprazole .  There is no upper GI on file.  Prediabetes: Her last A1c was 5.9%, 08/2023.  She is not taking any oral diabetic medication at this time.  She does not check her sugars.  Osteoporosis: She is not taking any calcium  or vitamin D  OTC.  She was referred to endocrinology but has not scheduled this appointment.  Bone density from 10/2022 reviewed.  Review of Systems  Past Medical History:  Diagnosis Date   Allergy     Anxiety    Arthritis    Chronic kidney disease    stage 4 renal failure past urologist was in [place]    Depression    Hypertension    Stroke [facility])    Urinary incontinence     Current Outpatient Medications  Medication Sig Dispense Refill   amLODipine  (NORVASC ) 10 MG tablet TAKE 1 TABLET BY MOUTH EVERY DAY 90 tablet 1   aspirin 81 MG chewable tablet Chew by mouth daily.     atorvastatin  (LIPITOR) 20 MG tablet TAKE 1 TABLET BY  MOUTH EVERY DAY 90 tablet 2   busPIRone  (BUSPAR ) 5 MG tablet Take 5 mg by mouth 2 (two) times daily as needed.     citalopram  (CELEXA ) 20 MG tablet TAKE 1 TABLET BY MOUTH EVERY DAY 90 tablet 1   hydrOXYzine  (ATARAX ) 10 MG tablet Take 1 tablet (10 mg total) by mouth 3 (three) times daily as needed. 30 tablet 1   loratadine  (CLARITIN ) 10 MG tablet TAKE 1 TABLET BY MOUTH EVERY DAY 90 tablet 2   metoprolol  succinate (TOPROL -XL) 25 MG 24 hr tablet TAKE 1 TABLET (25 MG TOTAL) BY MOUTH DAILY. 90 tablet 1   Olopatadine  HCl 0.2 % SOLN Apply 1 drop to eye daily. 2.5 mL 0   omeprazole  (PRILOSEC) 20 MG capsule TAKE 1 CAPSULE (20 MG TOTAL) BY MOUTH DAILY. 90 capsule 2   ondansetron  (ZOFRAN -ODT) 8 MG disintegrating tablet Take 1 tablet (8 mg total) by mouth every 8 (eight) hours as needed for nausea or vomiting. 20 tablet 0   oxymetazoline  (AFRIN) 0.05 % nasal spray Place 1 spray into both nostrils 2 (two) times daily.     No current facility-administered medications for this visit.    No Known Allergies  Family History  Problem Relation Age of Onset   Stroke Mother    Dementia Mother    Hypertension Mother  Cancer Father    Brain cancer Father    Skin cancer Sister     Social History   Socioeconomic History   Marital status: Married    Spouse name: Not on file   Number of children: Not on file   Years of education: Not on file   Highest education level: Not on file  Occupational History   Not on file  Tobacco Use   Smoking status: Former    Current packs/day: 0.00    Average packs/day: 0.3 packs/day for 40.0 years (10.0 ttl pk-yrs)    Types: Cigarettes    Start date: 01/01/1975    Quit date: 01/01/2015    Years since quitting: 9.1   Smokeless tobacco: Never  Vaping Use   Vaping status: Never Used  Substance and Sexual Activity   Alcohol use: Not Currently    Alcohol/week: 0.0 standard drinks of alcohol    Comment: occassional   Drug use: Yes    Frequency: 3.0 times per week     Types: Marijuana    Comment: enjoyment   Sexual activity: Not Currently  Other Topics Concern   Not on file  Social History Narrative   Not on file   Social Drivers of Health   Financial Resource Strain: Low Risk  (12/30/2023)   Overall Financial Resource Strain (CARDIA)    Difficulty of Paying Living Expenses: Not hard at all  Food Insecurity: No Food Insecurity (12/30/2023)   Hunger Vital Sign    Worried About Running Out of Food in the Last Year: Never true    Ran Out of Food in the Last Year: Never true  Transportation Needs: No Transportation Needs (12/30/2023)   PRAPARE - Administrator, Civil Service (Medical): No    Lack of Transportation (Non-Medical): No  Physical Activity: Inactive (12/30/2023)   Exercise Vital Sign    Days of Exercise per Week: 0 days    Minutes of Exercise per Session: 0 min  Stress: No Stress Concern Present (12/30/2023)   [employer] of Occupational Health - Occupational Stress Questionnaire    Feeling of Stress: Not at all  Social Connections: Socially Isolated (12/30/2023)   Social Connection and Isolation Panel    Frequency of Communication with Friends and Family: More than three times a week    Frequency of Social Gatherings with Friends and Family: More than three times a week    Attends Religious Services: Never    Database administrator or Organizations: No    Attends Banker Meetings: Never    Marital Status: Divorced  Catering manager Violence: Not At Risk (12/30/2023)   Humiliation, Afraid, Rape, and Kick questionnaire    Fear of Current or Ex-Partner: No    Emotionally Abused: No    Physically Abused: No    Sexually Abused: No     Constitutional: Denies fever, malaise, fatigue, headache or abrupt weight changes.  HEENT: Denies eye pain, eye redness, ear pain, ringing in the ears, wax buildup, runny nose, nasal congestion, bloody nose, or sore throat. Respiratory: Denies difficulty breathing, shortness of  breath, cough or sputum production.   Cardiovascular: Denies chest pain, chest tightness, palpitations or swelling in the hands or feet.  Gastrointestinal: Denies abdominal pain, bloating, constipation, diarrhea or blood in the stool.  GU: Denies urgency, frequency, pain with urination, burning sensation, blood in urine, odor or discharge. Musculoskeletal: Denies decrease in range of motion, difficulty with gait, muscle pain or joint pain and swelling.  Skin: Denies redness, rashes, lesions or ulcercations.  Neurological: Denies dizziness, difficulty with memory, difficulty with speech or problems with balance and coordination.  Psych: Patient has a history of anxiety and depression.  Denies SI/HI.  No other specific complaints in a complete review of systems (except as listed in HPI above).     Objective:   Physical Exam   There were no vitals taken for this visit.  Wt Readings from Last 3 Encounters:  12/30/23 201 lb (91.2 kg)  08/10/23 201 lb 12.8 oz (91.5 kg)  02/03/23 196 lb (88.9 kg)    General: Appears her stated age, obese, in NAD. Skin: Warm, dry and intact.  HEENT: Head: normal shape and size; Eyes: sclera white, no icterus, conjunctiva pink, PERRLA and EOMs intact;  Cardiovascular: Normal rate and rhythm. S1,S2 noted.  No murmur, rubs or gallops noted. No JVD or BLE edema. No carotid bruits noted. Pulmonary/Chest: Normal effort and positive vesicular breath sounds. No respiratory distress. No wheezes, rales or ronchi noted.  Abdomen: Normal bowel sounds.  Musculoskeletal: No difficulty with gait.  Neurological: Alert and oriented. Coordination normal.  Psychiatric: Mood and affect normal. Behavior is normal. Judgment and thought content normal.    BMET    Component Value Date/Time   NA 141 08/10/2023 0913   NA 144 12/21/2014 0907   NA 143 04/11/2014 1344   K 4.9 08/10/2023 0913   K 4.8 04/11/2014 1344   CL 109 08/10/2023 0913   CL 108 (H) 04/11/2014 1344   CO2  25 08/10/2023 0913   CO2 27 04/11/2014 1344   GLUCOSE 119 (H) 08/10/2023 0913   GLUCOSE 132 (H) 04/11/2014 1344   BUN 41 (H) 08/10/2023 0913   BUN 30 (H) 12/21/2014 0907   BUN 38 (H) 04/11/2014 1344   CREATININE 3.01 (H) 08/10/2023 0913   CALCIUM  9.1 08/10/2023 0913   CALCIUM  8.7 04/11/2014 1344   GFRNONAA 19 (L) 01/25/2020 0941   GFRAA 22 (L) 01/25/2020 0941    Lipid Panel     Component Value Date/Time   CHOL 133 08/10/2023 0913   CHOL 124 12/21/2014 0907   TRIG 81 08/10/2023 0913   HDL 54 08/10/2023 0913   HDL 40 12/21/2014 0907   CHOLHDL 2.5 08/10/2023 0913   LDLCALC 63 08/10/2023 0913    CBC    Component Value Date/Time   WBC 9.4 08/10/2023 0913   RBC 4.36 08/10/2023 0913   HGB 13.0 08/10/2023 0913   HCT 40.5 08/10/2023 0913   PLT 155 08/10/2023 0913   MCV 92.9 08/10/2023 0913   MCH 29.8 08/10/2023 0913   MCHC 32.1 08/10/2023 0913   RDW 12.2 08/10/2023 0913   LYMPHSABS 1,997 01/25/2020 0941   MONOABS 0.7 08/03/2019 1049   EOSABS 179 01/25/2020 0941   BASOSABS 31 01/25/2020 0941    Hgb A1C Lab Results  Component Value Date   HGBA1C 5.9 (H) 08/10/2023           Assessment & Plan:     RTC in 6 months for your annual exam Vanessa Laura, NP

## 2024-02-11 ENCOUNTER — Other Ambulatory Visit: Payer: Self-pay | Admitting: Internal Medicine

## 2024-02-11 DIAGNOSIS — I10 Essential (primary) hypertension: Secondary | ICD-10-CM

## 2024-02-11 DIAGNOSIS — N184 Chronic kidney disease, stage 4 (severe): Secondary | ICD-10-CM

## 2024-02-11 NOTE — Telephone Encounter (Signed)
 Courtesy refill. Patient will need an office visit for additional refills.  Requested Prescriptions  Pending Prescriptions Disp Refills   amLODipine  (NORVASC ) 10 MG tablet [Pharmacy Med Name: AMLODIPINE  BESYLATE 10 MG TAB] 30 tablet 0    Sig: TAKE 1 TABLET BY MOUTH EVERY DAY     Cardiovascular: Calcium  Channel Blockers 2 Passed - 02/11/2024 11:34 AM      Passed - Last BP in normal range    BP Readings from Last 1 Encounters:  08/10/23 134/84         Passed - Last Heart Rate in normal range    Pulse Readings from Last 1 Encounters:  02/03/23 66         Passed - Valid encounter within last 6 months    Recent Outpatient Visits           6 months ago Encounter for screening mammogram for breast cancer   [facility] [place], Angeline ORN, [place]

## 2024-03-10 ENCOUNTER — Other Ambulatory Visit: Payer: Self-pay | Admitting: Internal Medicine

## 2024-03-10 DIAGNOSIS — I10 Essential (primary) hypertension: Secondary | ICD-10-CM

## 2024-03-10 DIAGNOSIS — N184 Chronic kidney disease, stage 4 (severe): Secondary | ICD-10-CM

## 2024-03-13 NOTE — Telephone Encounter (Signed)
 Requested medication (s) are due for refill today: yes  Requested medication (s) are on the active medication list: yes  Last refill:  02/10/24  Future visit scheduled: no  Notes to clinic:  Unable to refill per protocol, courtesy refill already given, routing for provider approval.      Requested Prescriptions  Pending Prescriptions Disp Refills   amLODipine  (NORVASC ) 10 MG tablet [Pharmacy Med Name: AMLODIPINE  BESYLATE 10 MG TAB] 90 tablet 1    Sig: TAKE 1 TABLET BY MOUTH EVERY DAY     Cardiovascular: Calcium  Channel Blockers 2 Passed - 03/13/2024 10:47 AM      Passed - Last BP in normal range    BP Readings from Last 1 Encounters:  08/10/23 134/84         Passed - Last Heart Rate in normal range    Pulse Readings from Last 1 Encounters:  02/03/23 66         Passed - Valid encounter within last 6 months    Recent Outpatient Visits           7 months ago Encounter for screening mammogram for breast cancer   [facility] [place], Angeline ORN, [place]

## 2024-03-14 DIAGNOSIS — I129 Hypertensive chronic kidney disease with stage 1 through stage 4 chronic kidney disease, or unspecified chronic kidney disease: Secondary | ICD-10-CM | POA: Diagnosis not present

## 2024-03-14 DIAGNOSIS — N2581 Secondary hyperparathyroidism of renal origin: Secondary | ICD-10-CM | POA: Diagnosis not present

## 2024-03-14 DIAGNOSIS — E875 Hyperkalemia: Secondary | ICD-10-CM | POA: Diagnosis not present

## 2024-03-14 DIAGNOSIS — R809 Proteinuria, unspecified: Secondary | ICD-10-CM | POA: Diagnosis not present

## 2024-03-14 DIAGNOSIS — N184 Chronic kidney disease, stage 4 (severe): Secondary | ICD-10-CM | POA: Diagnosis not present

## 2024-04-11 DIAGNOSIS — N184 Chronic kidney disease, stage 4 (severe): Secondary | ICD-10-CM | POA: Diagnosis not present

## 2024-04-20 ENCOUNTER — Other Ambulatory Visit: Payer: Self-pay | Admitting: Internal Medicine

## 2024-04-20 DIAGNOSIS — F418 Other specified anxiety disorders: Secondary | ICD-10-CM

## 2024-04-21 NOTE — Telephone Encounter (Signed)
 Attempted to call patient to schedule appointment- no answer- left message to call office for appointment. Courtesy 30 day Rx sent Requested Prescriptions  Pending Prescriptions Disp Refills   citalopram  (CELEXA ) 20 MG tablet [Pharmacy Med Name: CITALOPRAM  HBR 20 MG TABLET] 90 tablet 1    Sig: TAKE 1 TABLET BY MOUTH EVERY DAY     Psychiatry:  Antidepressants - SSRI Passed - 04/21/2024  3:33 PM      Passed - Completed PHQ-2 or PHQ-9 in the last 360 days      Passed - Valid encounter within last 6 months    Recent Outpatient Visits           8 months ago Encounter for screening mammogram for breast cancer   [facility] [place], Angeline ORN, [place]

## 2024-05-09 DIAGNOSIS — N184 Chronic kidney disease, stage 4 (severe): Secondary | ICD-10-CM | POA: Diagnosis not present

## 2024-05-13 ENCOUNTER — Other Ambulatory Visit: Payer: Self-pay | Admitting: Internal Medicine

## 2024-05-13 DIAGNOSIS — I10 Essential (primary) hypertension: Secondary | ICD-10-CM

## 2024-05-16 NOTE — Telephone Encounter (Signed)
 Requested Prescriptions  Pending Prescriptions Disp Refills   metoprolol  succinate (TOPROL -XL) 25 MG 24 hr tablet [Pharmacy Med Name: METOPROLOL  SUCC ER 25 MG TAB] 90 tablet 0    Sig: TAKE 1 TABLET (25 MG TOTAL) BY MOUTH DAILY.     Cardiovascular:  Beta Blockers Passed - 05/16/2024 11:20 AM      Passed - Last BP in normal range    BP Readings from Last 1 Encounters:  08/10/23 134/84         Passed - Last Heart Rate in normal range    Pulse Readings from Last 1 Encounters:  02/03/23 66         Passed - Valid encounter within last 6 months    Recent Outpatient Visits           9 months ago Encounter for screening mammogram for breast cancer   [facility] [place], Angeline ORN, [place]

## 2024-05-23 ENCOUNTER — Other Ambulatory Visit: Payer: Self-pay | Admitting: Internal Medicine

## 2024-05-23 DIAGNOSIS — F418 Other specified anxiety disorders: Secondary | ICD-10-CM

## 2024-05-26 NOTE — Telephone Encounter (Signed)
 Requested medication (s) are due for refill today: Yes  Requested medication (s) are on the active medication list: Yes  Last refill:  04/21/24  Future visit scheduled:Yes  Notes to clinic:  See pharmacy request.    Requested Prescriptions  Pending Prescriptions Disp Refills   citalopram  (CELEXA ) 20 MG tablet [Pharmacy Med Name: CITALOPRAM  HBR 20 MG TABLET] 90 tablet 1    Sig: TAKE 1 TABLET BY MOUTH EVERY DAY     Psychiatry:  Antidepressants - SSRI Passed - 05/26/2024 10:16 AM      Passed - Completed PHQ-2 or PHQ-9 in the last 360 days      Passed - Valid encounter within last 6 months    Recent Outpatient Visits           9 months ago Encounter for screening mammogram for breast cancer   [facility] [place], Angeline ORN, [place]

## 2024-06-16 ENCOUNTER — Encounter: Payer: Self-pay | Admitting: Urology

## 2024-06-16 ENCOUNTER — Ambulatory Visit

## 2024-06-16 VITALS — BP 136/85 | HR 95 | Ht 67.0 in | Wt 196.0 lb

## 2024-06-16 DIAGNOSIS — N200 Calculus of kidney: Secondary | ICD-10-CM | POA: Diagnosis not present

## 2024-06-16 LAB — URINALYSIS, COMPLETE
Glucose, UA: NEGATIVE
Leukocytes,UA: NEGATIVE
Nitrite, UA: NEGATIVE
Specific Gravity, UA: 1.03 (ref 1.005–1.030)
Urobilinogen, Ur: 0.2 mg/dL (ref 0.2–1.0)
pH, UA: 5.5 (ref 5.0–7.5)

## 2024-06-16 LAB — MICROSCOPIC EXAMINATION

## 2024-06-16 NOTE — Progress Notes (Signed)
 "    06/16/2024 11:14 AM   Vanessa Camacho February 26, 1956 [id number]  Referring provider: Douglas Rule, MD [address] Professional [address] [place],  [place] [address]  Urological history: 1. 06/04/24: presented to [facility] endorsing a 3-4 day history of nausea and vomiting. Reported epigastric pain and RUQ pain. CT Abdomen Pelvis WO contrast was ordered and revealed a 2mm nonobstructing right renal stone.   Chief Complaint  Patient presents with   Nephrolithiasis    HPI: Vanessa Camacho is a 69 y.o. female with a PMH significant for CKD stage IV, hypertension, proteinuria, and hyperkalemia who presents today for nephrolithiasis.  The patient presented to [facility] on 06/04/24 reporting a 4-day history of right-sided abdominal pain associated with nausea and nonbilious nonbloody vomiting. Symptoms were intermittent without alleviating or aggravating factors. CT Abdomen Pelvis WO contrast was ordered and revealed a 2mm nonobstructing right renal stone.   Today she reports significant symptom improvement. She rates her pain a 2/3 on the pain scale. She was recently taking ibuprofen for pain management, but was instructed by her nephrologist to discontinue due to chronic kidney disease. She is being closely followed by her nephrologist. She was recently seen on 06/14/24 and has a follow-up scheduled for 07/26/24.  She now reports taking a liquid pain medication which she was given her daughter. She is unsure of the name of medication. She is taking Zofran  4 mg PRN.   She wears pads daily and reports blood seen on her pads as well as toilet paper when she wipes. She has increased her water intake and is now drinking 3-4 bottles of water daily.   The patient endorses right-sided hip soreness, but reports pain in chronic. She believes discomfort was exacerbated by sleeping in an awkward position.  She denies fever, chills, abdominal pain, dysuria, urinary frequency or urgency,  N/V/D.  PMH: Past Medical History:  Diagnosis Date   Allergy     Anxiety    Arthritis    Chronic kidney disease    stage 4 renal failure past urologist was in [place]    Depression    Hypertension    Stroke [facility])    Urinary incontinence     Surgical History: Past Surgical History:  Procedure Laterality Date   CHOLECYSTECTOMY      Home Medications:  Allergies as of 06/16/2024   No Known Allergies      Medication List        Accurate as of June 16, 2024 11:14 AM. If you have any questions, ask your nurse or doctor.          STOP taking these medications    fluticasone  50 MCG/ACT nasal spray Commonly known as: FLONASE  Stopped by: Marry KIDD Joshlynn Alfonzo       TAKE these medications    amLODipine  10 MG tablet Commonly known as: NORVASC  TAKE 1 TABLET BY MOUTH EVERY DAY   aspirin 81 MG chewable tablet Chew by mouth daily.   atorvastatin  20 MG tablet Commonly known as: LIPITOR TAKE 1 TABLET BY MOUTH EVERY DAY   busPIRone  5 MG tablet Commonly known as: BUSPAR  Take 5 mg by mouth 2 (two) times daily as needed.   citalopram  20 MG tablet Commonly known as: CELEXA  TAKE 1 TABLET BY MOUTH EVERY DAY   hydrOXYzine  10 MG tablet Commonly known as: ATARAX  Take 1 tablet (10 mg total) by mouth 3 (three) times daily as needed.   loratadine  10 MG tablet Commonly known as: CLARITIN  TAKE 1 TABLET BY  MOUTH EVERY DAY   metoprolol  succinate 25 MG 24 hr tablet Commonly known as: TOPROL -XL TAKE 1 TABLET (25 MG TOTAL) BY MOUTH DAILY.   Olopatadine  HCl 0.2 % Soln Apply 1 drop to eye daily.   omeprazole  20 MG capsule Commonly known as: PRILOSEC TAKE 1 CAPSULE (20 MG TOTAL) BY MOUTH DAILY.   ondansetron  8 MG disintegrating tablet Commonly known as: ZOFRAN -ODT Take 1 tablet (8 mg total) by mouth every 8 (eight) hours as needed for nausea or vomiting.   oxymetazoline  0.05 % nasal spray Commonly known as: AFRIN Place 1 spray into both nostrils 2 (two) times daily.         Allergies: Allergies[1]  Family History: Family History  Problem Relation Age of Onset   Stroke Mother    Dementia Mother    Hypertension Mother    Cancer Father    Brain cancer Father    Skin cancer Sister     Social History:  reports that she quit smoking about 9 years ago. Her smoking use included cigarettes. She started smoking about 49 years ago. She has a 10 pack-year smoking history. She has never used smokeless tobacco. She reports that she does not currently use alcohol. She reports current drug use. Frequency: 3.00 times per week. Drug: Marijuana.  ROS: Pertinent ROS in HPI  Physical Exam: BP 136/85   Pulse 95   Ht 5' 7 (1.702 m)   Wt 196 lb (88.9 kg)   BMI 30.70 kg/m   Constitutional:  Well nourished. Alert and oriented, No acute distress. HEENT: [place] AT, moist mucus membranes.   Cardiovascular: No clubbing, cyanosis, or edema. Respiratory: Normal respiratory effort, no increased work of breathing. GI: Abdomen is soft, non tender, non distended. GU: No CVA tenderness.  Skin: No rashes, bruises or suspicious lesions. Neurologic: Grossly intact, no focal deficits, moving all 4 extremities. Psychiatric: Normal mood and affect.  Laboratory Data: No results found for: PSA  Lab Results  Component Value Date   WBC 9.4 08/10/2023   HGB 13.0 08/10/2023   HCT 40.5 08/10/2023   MCV 92.9 08/10/2023   PLT 155 08/10/2023    Lab Results  Component Value Date   CREATININE 3.01 (H) 08/10/2023    No results found for: TESTOSTERONE  Lab Results  Component Value Date   HGBA1C 5.9 (H) 08/10/2023    Urinalysis No results found for: COLORURINE, APPEARANCEUR, LABSPEC, PHURINE, GLUCOSEU, HGBUR, BILIRUBINUR, KETONESUR, PROTEINUR, UROBILINOGEN, NITRITE, LEUKOCYTESUR  No results found for: LABMICR, WBCUA, RBCUA, LABEPIT, MUCUS, BACTERIA   I have reviewed the labs.   Pertinent Imaging: @CT @ @ultrasound @ @KUB @ I  have independently reviewed the films.    Assessment & Plan:   Vanessa Camacho is a 69 y.o. female with a PMH significant for CKD stage IV, hypertension, proteinuria, and hyperkalemia who presents today for nephrolithiasis. The patient's symptoms have improved significantly. We discussed expectant management with planned imaging to follow kidney stone.   1. Nephrolithiasis (Primary) - Urinalysis, Complete - Abdomen 1 view (KUB -  - RTC in 1 year for follow-up or call for appointment earlier if needed    No follow-ups on file.  These notes generated with voice recognition software. I apologize for typographical errors.  Marry MALVA Sara, PA-C  [facility] [address]  [address] [place], [place] [address] [phone]       [1] No Known Allergies  "

## 2025-01-05 ENCOUNTER — Ambulatory Visit

## 2025-01-10 ENCOUNTER — Ambulatory Visit

## 2025-06-15 ENCOUNTER — Ambulatory Visit: Admitting: Urology
